# Patient Record
Sex: Female | Born: 1985 | Race: White | Hispanic: No | Marital: Single | Smoking: Current every day smoker
Health system: Southern US, Community
[De-identification: ages and names within clinical notes are randomized; demographics above are authoritative.]

## PROBLEM LIST (undated history)

## (undated) ENCOUNTER — Inpatient Hospital Stay (HOSPITAL_COMMUNITY): Payer: Self-pay

## (undated) DIAGNOSIS — N879 Dysplasia of cervix uteri, unspecified: Secondary | ICD-10-CM

## (undated) DIAGNOSIS — N200 Calculus of kidney: Secondary | ICD-10-CM

## (undated) DIAGNOSIS — Z8619 Personal history of other infectious and parasitic diseases: Secondary | ICD-10-CM

## (undated) DIAGNOSIS — N809 Endometriosis, unspecified: Secondary | ICD-10-CM

## (undated) DIAGNOSIS — Z86711 Personal history of pulmonary embolism: Secondary | ICD-10-CM

## (undated) DIAGNOSIS — J45909 Unspecified asthma, uncomplicated: Secondary | ICD-10-CM

## (undated) HISTORY — PX: GASTRIC BYPASS: SHX52

## (undated) HISTORY — PX: LAPAROSCOPY: SHX197

## (undated) HISTORY — DX: Personal history of pulmonary embolism: Z86.711

## (undated) HISTORY — DX: Personal history of other infectious and parasitic diseases: Z86.19

## (undated) HISTORY — PX: CHOLECYSTECTOMY: SHX55

---
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
## 2005-01-09 ENCOUNTER — Ambulatory Visit: Payer: Self-pay | Admitting: Family Medicine

## 2005-03-16 ENCOUNTER — Ambulatory Visit: Payer: Self-pay | Admitting: Family Medicine

## 2005-03-16 ENCOUNTER — Other Ambulatory Visit: Admission: RE | Admit: 2005-03-16 | Discharge: 2005-03-16 | Payer: Self-pay | Admitting: Family Medicine

## 2005-03-23 ENCOUNTER — Ambulatory Visit: Payer: Self-pay | Admitting: Family Medicine

## 2005-04-16 ENCOUNTER — Ambulatory Visit: Payer: Self-pay | Admitting: Family Medicine

## 2005-04-30 ENCOUNTER — Ambulatory Visit: Payer: Self-pay | Admitting: Family Medicine

## 2005-05-09 ENCOUNTER — Ambulatory Visit: Payer: Self-pay | Admitting: Family Medicine

## 2005-05-31 ENCOUNTER — Ambulatory Visit: Payer: Self-pay | Admitting: Family Medicine

## 2005-06-14 ENCOUNTER — Ambulatory Visit: Payer: Self-pay | Admitting: Family Medicine

## 2005-07-14 ENCOUNTER — Emergency Department (HOSPITAL_COMMUNITY): Admission: EM | Admit: 2005-07-14 | Discharge: 2005-07-15 | Payer: Self-pay | Admitting: Emergency Medicine

## 2005-07-16 ENCOUNTER — Emergency Department (HOSPITAL_COMMUNITY): Admission: EM | Admit: 2005-07-16 | Discharge: 2005-07-16 | Payer: Self-pay | Admitting: Emergency Medicine

## 2005-07-19 ENCOUNTER — Ambulatory Visit: Payer: Self-pay | Admitting: Family Medicine

## 2005-08-06 ENCOUNTER — Ambulatory Visit: Payer: Self-pay | Admitting: Family Medicine

## 2005-09-12 ENCOUNTER — Ambulatory Visit: Payer: Self-pay | Admitting: Family Medicine

## 2005-11-30 ENCOUNTER — Ambulatory Visit: Payer: Self-pay | Admitting: Family Medicine

## 2006-07-11 DIAGNOSIS — Z86711 Personal history of pulmonary embolism: Secondary | ICD-10-CM | POA: Diagnosis present

## 2007-10-17 ENCOUNTER — Emergency Department (HOSPITAL_COMMUNITY): Admission: EM | Admit: 2007-10-17 | Discharge: 2007-10-18 | Payer: Self-pay | Admitting: Emergency Medicine

## 2007-11-04 ENCOUNTER — Emergency Department (HOSPITAL_COMMUNITY): Admission: EM | Admit: 2007-11-04 | Discharge: 2007-11-04 | Payer: Self-pay | Admitting: Emergency Medicine

## 2007-11-20 ENCOUNTER — Emergency Department (HOSPITAL_COMMUNITY): Admission: EM | Admit: 2007-11-20 | Discharge: 2007-11-20 | Payer: Self-pay | Admitting: Family Medicine

## 2007-11-28 ENCOUNTER — Emergency Department (HOSPITAL_COMMUNITY): Admission: EM | Admit: 2007-11-28 | Discharge: 2007-11-28 | Payer: Self-pay | Admitting: Family Medicine

## 2008-11-15 ENCOUNTER — Observation Stay (HOSPITAL_COMMUNITY): Admission: EM | Admit: 2008-11-15 | Discharge: 2008-11-15 | Payer: Self-pay | Admitting: Emergency Medicine

## 2010-12-21 LAB — CBC
HCT: 42 % (ref 36.0–46.0)
Hemoglobin: 13.9 g/dL (ref 12.0–15.0)
MCHC: 33.1 g/dL (ref 30.0–36.0)
MCV: 88.5 fL (ref 78.0–100.0)
Platelets: 288 10*3/uL (ref 150–400)
RBC: 4.74 MIL/uL (ref 3.87–5.11)
RDW: 20.6 % — ABNORMAL HIGH (ref 11.5–15.5)
WBC: 8 10*3/uL (ref 4.0–10.5)

## 2010-12-21 LAB — COMPREHENSIVE METABOLIC PANEL
ALT: 23 U/L (ref 0–35)
AST: 20 U/L (ref 0–37)
Albumin: 4.4 g/dL (ref 3.5–5.2)
Alkaline Phosphatase: 92 U/L (ref 39–117)
BUN: 6 mg/dL (ref 6–23)
CO2: 23 mEq/L (ref 19–32)
Calcium: 9.5 mg/dL (ref 8.4–10.5)
Chloride: 107 mEq/L (ref 96–112)
Creatinine, Ser: 0.59 mg/dL (ref 0.4–1.2)
GFR calc Af Amer: >60 mL/min (ref >60)
GFR calc non Af Amer: >60 mL/min (ref >60)
Glucose, Bld: 86 mg/dL (ref 70–99)
Potassium: 3.7 mEq/L (ref 3.5–5.1)
Sodium: 139 mEq/L (ref 135–145)
Total Bilirubin: 0.6 mg/dL (ref 0.3–1.2)
Total Protein: 6.8 g/dL (ref 6.0–8.3)

## 2010-12-21 LAB — DIFFERENTIAL
Basophils Absolute: 0 10*3/uL (ref 0.0–0.1)
Basophils Relative: 0 % (ref 0–1)
Eosinophils Absolute: 0.2 10*3/uL (ref 0.0–0.7)
Eosinophils Relative: 3 % (ref 0–5)
Lymphocytes Relative: 38 % (ref 12–46)
Lymphs Abs: 3 10*3/uL (ref 0.7–4.0)
Monocytes Absolute: 0.5 10*3/uL (ref 0.1–1.0)
Monocytes Relative: 6 % (ref 3–12)
Neutro Abs: 4.3 10*3/uL (ref 1.7–7.7)
Neutrophils Relative %: 54 % (ref 43–77)

## 2010-12-21 LAB — URINALYSIS, ROUTINE W REFLEX MICROSCOPIC
Bilirubin Urine: NEGATIVE
Glucose, UA: NEGATIVE mg/dL
Hgb urine dipstick: NEGATIVE
Ketones, ur: >80 mg/dL — AB
Nitrite: NEGATIVE
Protein, ur: NEGATIVE mg/dL
Specific Gravity, Urine: 1.028 (ref 1.005–1.030)
Urobilinogen, UA: 0.2 mg/dL (ref 0.0–1.0)
pH: 6 (ref 5.0–8.0)

## 2010-12-21 LAB — POCT PREGNANCY, URINE: Preg Test, Ur: NEGATIVE

## 2011-01-23 NOTE — H&P (Signed)
NAMESHAUNICE, LEVITAN NO.:  [id number]   MEDICAL RECORD NO.:  [id number]          PATIENT TYPE:  INP   LOCATION:  1528                         FACILITY:  [facility]   PHYSICIAN:  Della Goo, M.D. DATE OF BIRTH:  1986-08-18   DATE OF ADMISSION:  11/15/2008  DATE OF DISCHARGE:                              HISTORY & PHYSICAL   PRIMARY CARE PHYSICIAN:  Unassigned.   CHIEF COMPLAINT:  Right-sided flank pain.   HISTORY OF PRESENT ILLNESS:  This is a 25 year old female who presents  to the emergency department with complaints of severe right-sided flank  pain which started in the early a.m.  She states the pain radiates into  the front of her abdomen.  She also states that she has had diarrhea for  the past 2 days and her son had been diagnosed with C. difficile  colitis.  She states that her diarrhea smells similar to her son's.  She  denies having any fevers or chills.  The patient does have a history of  previous kidney stones.  She denies having any nausea or vomiting.   PAST MEDICAL HISTORY:  1. Significant for a Roux-en-Y gastric bypass surgery.  2. History of asthma.  3. History of nephrolithiasis.  4. The patient is status post cholecystectomy.   MEDICATIONS:  Include albuterol, clonazepam and Cipro.   ALLERGIES:  MORPHINE, which causes itching.   SOCIAL HISTORY:  The patient smokes one-half pack of cigarettes daily.  She is a nondrinker and she denies any illicit drug usage.   FAMILY HISTORY:  Noncontributory.   REVIEW OF SYSTEMS:  Pertinents are mentioned above.   PHYSICAL EXAMINATION FINDINGS:  This is a 25 year old well-nourished,  well-developed female who is in discomfort but no acute distress.  VITAL SIGNS:  Temperature 97.8, blood pressure 125/83, heart rate 69,  respirations 24 and O2 saturations 100%.  HEENT EXAMINATION:  Normocephalic, atraumatic.  Pupils are equally  round, reactive to light.  Extraocular movements are intact,  funduscopic  benign.  Nares are patent bilaterally.  Oropharynx is clear.  NECK:  Supple, full range of motion.  No thyromegaly, adenopathy or  jugular venous distention.  CARDIOVASCULAR:  Regular rate and rhythm.  No murmurs, gallops or rubs.  LUNGS:  Clear to auscultation bilaterally.  ABDOMEN:  Positive bowel sounds, soft, mildly tender to palpation along  the right lateral abdominal area.  There is no rebound or guarding.  BACK EXAMINATION:  There is mild tenderness superficially along the  costovertebral angle margin.  EXTREMITIES:  Without cyanosis, clubbing or edema.  NEUROLOGIC EXAMINATION:  Nonfocal.   LABORATORY STUDIES:  White blood cell count 8.0, hemoglobin 13.9,  hematocrit 42.0, MCV 88.5, platelets 288, neutrophils 54% lymphocytes  38%.  Chemistry with a sodium of 139, potassium 3.7, chloride 107,  carbon dioxide 23, BUN 6, creatinine 0.59 and glucose 86.  Urinalysis is  essentially negative.  The only positive is urine ketones greater than  80.  CT scan of the abdomen and pelvis performed without contrast  reveals no evidence of obstruction or urinary tract calculi.   ASSESSMENT:  A 25 year old  female being admitted with:  1. Right flank pain.  2. Abdominal pain.  3. Possible partially treated urinary tract infection.  4. Acute diarrheal syndrome.   PLAN:  The patient will be admitted, placed on IV fluids for fluid  resuscitation, and pain control therapy has been ordered.  The patient  will continue on antibiotic therapy of Cipro, which will be given IV,  and metronidazole therapy will also be started.  Stool studies will also  be sent for culture and sensitivity and C. difficile toxin.  The patient  will also be placed on DVT and GI prophylaxis.  Further workup will  ensue pending results of the patient's clinical course.      Della Goo, M.D.  Electronically Signed     HJ/MEDQ  D:  11/15/2008  T:  11/15/2008  Job:  [id number]

## 2011-06-01 LAB — DIFFERENTIAL
Band Neutrophils: 0
Basophils Absolute: 0.1
Basophils Absolute: 0
Basophils Relative: 1
Basophils Relative: 0
Blasts: 0
Eosinophils Absolute: 0.2
Eosinophils Absolute: 0.4
Eosinophils Relative: 3
Eosinophils Relative: 4
Lymphocytes Relative: 35
Lymphocytes Relative: 40
Lymphs Abs: 2.5
Lymphs Abs: 3.3
Metamyelocytes Relative: 0
Monocytes Absolute: 0.6
Monocytes Absolute: 0.4
Monocytes Relative: 8
Monocytes Relative: 5
Myelocytes: 0
Neutro Abs: 3.6
Neutro Abs: 4.1
Neutrophils Relative %: 53
Neutrophils Relative %: 50
Promyelocytes Absolute: 0
nRBC: 0

## 2011-06-01 LAB — CBC
HCT: 38.7
HCT: 40.1
Hemoglobin: 13.1
Hemoglobin: 13.4
MCHC: 33.8
MCHC: 33.3
MCV: 86.9
MCV: 86.4
Platelets: 263
Platelets: 367
RBC: 4.45
RBC: 4.64
RDW: 15.8 — ABNORMAL HIGH
RDW: 15.2
WBC: 7
WBC: 8.2

## 2011-06-01 LAB — COMPREHENSIVE METABOLIC PANEL
ALT: 17
AST: 24
Albumin: 4.1
Alkaline Phosphatase: 104
BUN: 6
CO2: 26
Calcium: 9.4
Chloride: 107
Creatinine, Ser: 0.69
GFR calc Af Amer: >60
GFR calc non Af Amer: >60
Glucose, Bld: 105 — ABNORMAL HIGH
Potassium: 3.9
Sodium: 140
Total Bilirubin: 0.4
Total Protein: 6.5

## 2011-06-01 LAB — I-STAT 8, (EC8 V) (CONVERTED LAB)
Acid-base deficit: 2
BUN: <3 — ABNORMAL LOW
Bicarbonate: 21.7
Chloride: 107
Glucose, Bld: 97
HCT: 41
Hemoglobin: 13.9
Operator id: 295131
Potassium: 3.8
Sodium: 138
TCO2: 23
pCO2, Ven: 32.2 — ABNORMAL LOW
pH, Ven: 7.436 — ABNORMAL HIGH

## 2011-06-01 LAB — URINALYSIS, ROUTINE W REFLEX MICROSCOPIC
Bilirubin Urine: NEGATIVE
Bilirubin Urine: NEGATIVE
Glucose, UA: NEGATIVE
Glucose, UA: NEGATIVE
Hgb urine dipstick: NEGATIVE
Hgb urine dipstick: NEGATIVE
Ketones, ur: NEGATIVE
Ketones, ur: NEGATIVE
Nitrite: NEGATIVE
Nitrite: NEGATIVE
Protein, ur: NEGATIVE
Protein, ur: NEGATIVE
Specific Gravity, Urine: 1.01
Specific Gravity, Urine: 1.015
Urobilinogen, UA: 0.2
Urobilinogen, UA: 0.2
pH: 7
pH: 6

## 2011-06-01 LAB — URINE MICROSCOPIC-ADD ON

## 2011-06-01 LAB — HEPATIC FUNCTION PANEL
ALT: 31
AST: 44 — ABNORMAL HIGH
Albumin: 3.5
Alkaline Phosphatase: 94
Bilirubin, Direct: 0.2
Indirect Bilirubin: 0.4
Total Bilirubin: 0.6
Total Protein: 5.9 — ABNORMAL LOW

## 2011-06-01 LAB — LIPASE, BLOOD
Lipase: 17
Lipase: 29

## 2011-06-01 LAB — POCT PREGNANCY, URINE
Operator id: 294501
Preg Test, Ur: NEGATIVE

## 2011-06-01 LAB — POCT I-STAT CREATININE
Creatinine, Ser: 0.9
Operator id: 295131

## 2011-06-04 LAB — COMPREHENSIVE METABOLIC PANEL
AST: 15
Albumin: 4.3
Alkaline Phosphatase: 109
BUN: 10
Chloride: 110
GFR calc Af Amer: >60
Potassium: 3.8
Total Bilirubin: 0.2 — ABNORMAL LOW

## 2011-06-04 LAB — DIFFERENTIAL
Basophils Absolute: 0
Basophils Relative: 0
Eosinophils Absolute: 0.2
Eosinophils Relative: 3
Monocytes Absolute: 0.5

## 2011-06-04 LAB — POCT URINALYSIS DIP (DEVICE)
Glucose, UA: NEGATIVE
Hgb urine dipstick: NEGATIVE
Nitrite: NEGATIVE
Operator id: 247071
Protein, ur: 100 — AB
Urobilinogen, UA: 0.2

## 2011-06-04 LAB — URINALYSIS, ROUTINE W REFLEX MICROSCOPIC
Bilirubin Urine: NEGATIVE
Ketones, ur: NEGATIVE
Nitrite: NEGATIVE
Urobilinogen, UA: 0.2

## 2011-06-04 LAB — CBC
HCT: 39.3
Platelets: 288
WBC: 8.4

## 2011-06-04 LAB — POCT PREGNANCY, URINE: Preg Test, Ur: NEGATIVE

## 2011-06-04 LAB — PREGNANCY, URINE: Preg Test, Ur: NEGATIVE

## 2013-03-12 ENCOUNTER — Encounter (HOSPITAL_COMMUNITY): Payer: Self-pay | Admitting: *Deleted

## 2013-03-12 ENCOUNTER — Inpatient Hospital Stay (HOSPITAL_COMMUNITY)
Admission: AD | Admit: 2013-03-12 | Discharge: 2013-03-12 | Disposition: A | Discharge status: Home / Self Care | Payer: Self-pay | Source: Ambulatory Visit | Attending: Obstetrics & Gynecology | Admitting: Obstetrics & Gynecology

## 2013-03-12 DIAGNOSIS — N949 Unspecified condition associated with female genital organs and menstrual cycle: Secondary | ICD-10-CM | POA: Insufficient documentation

## 2013-03-12 DIAGNOSIS — R109 Unspecified abdominal pain: Secondary | ICD-10-CM | POA: Insufficient documentation

## 2013-03-12 DIAGNOSIS — R102 Pelvic and perineal pain: Secondary | ICD-10-CM

## 2013-03-12 DIAGNOSIS — N926 Irregular menstruation, unspecified: Secondary | ICD-10-CM | POA: Insufficient documentation

## 2013-03-12 HISTORY — DX: Unspecified asthma, uncomplicated: J45.909

## 2013-03-12 HISTORY — DX: Calculus of kidney: N20.0

## 2013-03-12 HISTORY — DX: Dysplasia of cervix uteri, unspecified: N87.9

## 2013-03-12 HISTORY — DX: Endometriosis, unspecified: N80.9

## 2013-03-12 LAB — URINALYSIS, ROUTINE W REFLEX MICROSCOPIC
Hgb urine dipstick: NEGATIVE
Ketones, ur: NEGATIVE mg/dL
Protein, ur: NEGATIVE mg/dL
Urobilinogen, UA: 0.2 mg/dL (ref 0.0–1.0)

## 2013-03-12 LAB — POCT PREGNANCY, URINE: Preg Test, Ur: NEGATIVE

## 2013-03-12 MED ORDER — HYDROMORPHONE HCL PF 2 MG/ML IJ SOLN
2.0000 mg | Freq: Once | INTRAMUSCULAR | Status: AC
  Administered 2013-03-12: 2 mg via INTRAMUSCULAR
  Filled 2013-03-12: qty 1

## 2013-03-12 MED ORDER — OXYCODONE-ACETAMINOPHEN 5-325 MG PO TABS: 1.0000 | ORAL_TABLET | Freq: Four times a day (QID) | ORAL | Status: DC | PRN

## 2013-03-12 NOTE — MAU Note (Signed)
Patient states she has a history of endometriosis and has been getting her care in [place]. Has had pain since her last period on 6-19 but has become severe since yesterday. Denies bleeding and has a vaginal discharge.

## 2013-03-12 NOTE — MAU Provider Note (Signed)
History     CSN: [id number]  Arrival date and time: 03/12/13 1721   First Provider Initiated Contact with Patient 03/12/13 1848      Chief Complaint  Patient presents with  . Abdominal Pain   HPI  Pt is a G1P1 here with report of lower pelvic pain that worsened this AM.  Patient's last menstrual period was 02/26/2013.  Pt has history of endometriosis and irregular cycles diagnoses by [facility]).  Pain has been well controlled after depo lupron until recently.  Currently sexually active, using condoms.  Pt declines STD testing.   Past Medical History  Diagnosis Date  . Endometriosis   . Asthma   . Kidney stones   . Cervical dysplasia     Past Surgical History  Procedure Laterality Date  . Laparoscopy    . Gastric bypass    . Cholecystectomy      History reviewed. No pertinent family history.  History  Substance Use Topics  . Smoking status: Current Every Day Smoker -- 1.00 packs/day    Types: Cigarettes  . Smokeless tobacco: Not on file  . Alcohol Use: No    Allergies: No Known Allergies  Prescriptions prior to admission  Medication Sig Dispense Refill  . acetaminophen (TYLENOL) 500 MG tablet Take 500 mg by mouth every 6 (six) hours as needed for pain.        Review of Systems  Gastrointestinal: Positive for nausea and abdominal pain (pelvic). Negative for vomiting.  All other systems reviewed and are negative.   Physical Exam   Blood pressure 121/80, pulse 101, temperature 98.6 F (37 C), temperature source Oral, resp. rate 20, height 5' 2.5" (1.588 m), weight 71.759 kg (158 lb 3.2 oz), last menstrual period 02/26/2013, SpO2 100.00%.  Physical Exam  Constitutional: She is oriented to person, place, and time. She appears well-developed and well-nourished. She appears distressed.  Appears uncomfortable  HENT:  Head: Normocephalic.  Eyes: Pupils are equal, round, and reactive to light.  Neck: Normal range of motion. Neck supple.   Cardiovascular: Normal rate, regular rhythm and normal heart sounds.   Respiratory: Effort normal and breath sounds normal.  GI: Soft. There is tenderness (lower pelvis).  Genitourinary: Cervix exhibits no motion tenderness. Right adnexum displays no mass and no tenderness. Left adnexum displays no mass and no tenderness. No vaginal discharge found.  Negative cervical motion tenderness  Neurological: She is alert and oriented to person, place, and time. She has normal reflexes.  Skin: Skin is warm and dry.    MAU Course  Procedures  Results for orders placed during the hospital encounter of 03/12/13 (from the past 24 hour(s))  URINALYSIS, ROUTINE W REFLEX MICROSCOPIC     Status: None   Collection Time    03/12/13  5:50 PM      Result Value Range   Color, Urine YELLOW  YELLOW   APPearance CLEAR  CLEAR   Specific Gravity, Urine 1.010  1.005 - 1.030   pH 6.0  5.0 - 8.0   Glucose, UA NEGATIVE  NEGATIVE mg/dL   Hgb urine dipstick NEGATIVE  NEGATIVE   Bilirubin Urine NEGATIVE  NEGATIVE   Ketones, ur NEGATIVE  NEGATIVE mg/dL   Protein, ur NEGATIVE  NEGATIVE mg/dL   Urobilinogen, UA 0.2  0.0 - 1.0 mg/dL   Nitrite NEGATIVE  NEGATIVE   Leukocytes, UA NEGATIVE  NEGATIVE  POCT PREGNANCY, URINE     Status: None   Collection Time    03/12/13  5:57  PM      Result Value Range   Preg Test, Ur NEGATIVE  NEGATIVE   Dilaudid 2 mg IM  1945 Pt reports relief with symptoms  Assessment and Plan  Pelvic Pain   Plan: DC to home RX Percocet 5/325 #15 (no refill) Follow-up in GYN clinic - message routed  [facility] 03/12/2013, 6:49 PM

## 2013-04-01 ENCOUNTER — Encounter: Payer: Self-pay | Admitting: Obstetrics & Gynecology

## 2013-05-06 ENCOUNTER — Encounter: Payer: Self-pay | Admitting: Obstetrics & Gynecology

## 2013-08-28 DIAGNOSIS — Z720 Tobacco use: Secondary | ICD-10-CM | POA: Insufficient documentation

## 2013-08-28 DIAGNOSIS — J45909 Unspecified asthma, uncomplicated: Secondary | ICD-10-CM | POA: Insufficient documentation

## 2014-02-27 ENCOUNTER — Emergency Department (HOSPITAL_COMMUNITY)
Admission: EM | Admit: 2014-02-27 | Discharge: 2014-02-27 | Disposition: A | Discharge status: Home / Self Care | Payer: Medicaid Other | Attending: Emergency Medicine | Admitting: Emergency Medicine

## 2014-02-27 ENCOUNTER — Emergency Department (HOSPITAL_COMMUNITY): Payer: Medicaid Other

## 2014-02-27 ENCOUNTER — Encounter (HOSPITAL_COMMUNITY): Payer: Self-pay | Admitting: Emergency Medicine

## 2014-02-27 DIAGNOSIS — N898 Other specified noninflammatory disorders of vagina: Secondary | ICD-10-CM | POA: Diagnosis not present

## 2014-02-27 DIAGNOSIS — N949 Unspecified condition associated with female genital organs and menstrual cycle: Secondary | ICD-10-CM | POA: Insufficient documentation

## 2014-02-27 DIAGNOSIS — Z8742 Personal history of other diseases of the female genital tract: Secondary | ICD-10-CM | POA: Insufficient documentation

## 2014-02-27 DIAGNOSIS — F17200 Nicotine dependence, unspecified, uncomplicated: Secondary | ICD-10-CM | POA: Diagnosis not present

## 2014-02-27 DIAGNOSIS — J45909 Unspecified asthma, uncomplicated: Secondary | ICD-10-CM | POA: Insufficient documentation

## 2014-02-27 DIAGNOSIS — Z3202 Encounter for pregnancy test, result negative: Secondary | ICD-10-CM | POA: Insufficient documentation

## 2014-02-27 DIAGNOSIS — R102 Pelvic and perineal pain: Secondary | ICD-10-CM

## 2014-02-27 LAB — COMPREHENSIVE METABOLIC PANEL
ALT: 10 U/L (ref 0–35)
AST: 15 U/L (ref 0–37)
Albumin: 3.8 g/dL (ref 3.5–5.2)
Alkaline Phosphatase: 72 U/L (ref 39–117)
BUN: 15 mg/dL (ref 6–23)
CALCIUM: 9.5 mg/dL (ref 8.4–10.5)
CO2: 23 meq/L (ref 19–32)
CREATININE: 0.69 mg/dL (ref 0.50–1.10)
Chloride: 101 mEq/L (ref 96–112)
GLUCOSE: 100 mg/dL — AB (ref 70–99)
Potassium: 4.2 mEq/L (ref 3.7–5.3)
SODIUM: 137 meq/L (ref 137–147)
Total Bilirubin: <0.2 mg/dL — ABNORMAL LOW (ref 0.3–1.2)
Total Protein: 7.6 g/dL (ref 6.0–8.3)

## 2014-02-27 LAB — CBC WITH DIFFERENTIAL/PLATELET
Basophils Absolute: 0 10*3/uL (ref 0.0–0.1)
Basophils Relative: 0 % (ref 0–1)
EOS PCT: 7 % — AB (ref 0–5)
Eosinophils Absolute: 0.4 10*3/uL (ref 0.0–0.7)
HEMATOCRIT: 39.2 % (ref 36.0–46.0)
HEMOGLOBIN: 12.8 g/dL (ref 12.0–15.0)
LYMPHS ABS: 3.4 10*3/uL (ref 0.7–4.0)
LYMPHS PCT: 55 % — AB (ref 12–46)
MCH: 26 pg (ref 26.0–34.0)
MCHC: 32.7 g/dL (ref 30.0–36.0)
MCV: 79.7 fL (ref 78.0–100.0)
MONO ABS: 0.2 10*3/uL (ref 0.1–1.0)
MONOS PCT: 4 % (ref 3–12)
Neutro Abs: 2.1 10*3/uL (ref 1.7–7.7)
Neutrophils Relative %: 34 % — ABNORMAL LOW (ref 43–77)
Platelets: 304 10*3/uL (ref 150–400)
RBC: 4.92 MIL/uL (ref 3.87–5.11)
RDW: 17.3 % — ABNORMAL HIGH (ref 11.5–15.5)
WBC: 6.1 10*3/uL (ref 4.0–10.5)

## 2014-02-27 LAB — URINALYSIS, ROUTINE W REFLEX MICROSCOPIC
Glucose, UA: NEGATIVE mg/dL
HGB URINE DIPSTICK: NEGATIVE
KETONES UR: NEGATIVE mg/dL
Leukocytes, UA: NEGATIVE
Nitrite: NEGATIVE
PH: 5.5 (ref 5.0–8.0)
Protein, ur: NEGATIVE mg/dL
Urobilinogen, UA: 0.2 mg/dL (ref 0.0–1.0)

## 2014-02-27 LAB — WET PREP, GENITAL
CLUE CELLS WET PREP: NONE SEEN
TRICH WET PREP: NONE SEEN
YEAST WET PREP: NONE SEEN

## 2014-02-27 LAB — PREGNANCY, URINE: Preg Test, Ur: NEGATIVE

## 2014-02-27 MED ORDER — OXYCODONE-ACETAMINOPHEN 5-325 MG PO TABS: 1.0000 | ORAL_TABLET | ORAL | Status: DC | PRN

## 2014-02-27 MED ORDER — HYDROMORPHONE HCL PF 1 MG/ML IJ SOLN
1.0000 mg | Freq: Once | INTRAMUSCULAR | Status: AC
  Administered 2014-02-27: 1 mg via INTRAVENOUS
  Filled 2014-02-27: qty 1

## 2014-02-27 MED ORDER — OXYCODONE-ACETAMINOPHEN 5-325 MG PO TABS
2.0000 | ORAL_TABLET | Freq: Once | ORAL | Status: AC
  Administered 2014-02-27: 2 via ORAL
  Filled 2014-02-27: qty 2

## 2014-02-27 MED ORDER — MORPHINE SULFATE 4 MG/ML IJ SOLN
4.0000 mg | Freq: Once | INTRAMUSCULAR | Status: AC
  Administered 2014-02-27: 4 mg via INTRAVENOUS
  Filled 2014-02-27: qty 1

## 2014-02-27 NOTE — Discharge Instructions (Signed)
Take percocet for severe pain - Please be careful with this medication.  It can cause drowsiness.  Use caution while driving, operating machinery, drinking alcohol, or any other activities that may impair your physical or mental abilities.   Return to the emergency department if you develop any changing/worsening condition, or any other concerns (please read additional information regarding your condition below)  Abdominal Pain Many things can cause abdominal pain. Usually, abdominal pain is not caused by a disease and will improve without treatment. It can often be observed and treated at home. Your health care provider will do a physical exam and possibly order blood tests and X-rays to help determine the seriousness of your pain. However, in many cases, more time must pass before a clear cause of the pain can be found. Before that point, your health care provider may not know if you need more testing or further treatment. HOME CARE INSTRUCTIONS  Monitor your abdominal pain for any changes. The following actions may help to alleviate any discomfort you are experiencing:  Only take over-the-counter or prescription medicines as directed by your health care provider.  Do not take laxatives unless directed to do so by your health care provider.  Try a clear liquid diet (broth, tea, or water) as directed by your health care provider. Slowly move to a bland diet as tolerated. SEEK MEDICAL CARE IF:  You have unexplained abdominal pain.  You have abdominal pain associated with nausea or diarrhea.  You have pain when you urinate or have a bowel movement.  You experience abdominal pain that wakes you in the night.  You have abdominal pain that is worsened or improved by eating food.  You have abdominal pain that is worsened with eating fatty foods.  You have a fever. SEEK IMMEDIATE MEDICAL CARE IF:   Your pain does not go away within 2 hours.  You keep throwing up (vomiting).  Your pain is  felt only in portions of the abdomen, such as the right side or the left lower portion of the abdomen.  You pass bloody or black tarry stools. MAKE SURE YOU:  Understand these instructions.   Will watch your condition.   Will get help right away if you are not doing well or get worse.  Document Released: 06/06/2005 Document Revised: 09/01/2013 Document Reviewed: 05/06/2013 [facility] Patient Information 2015 [place], [place]. This information is not intended to replace advice given to you by your health care provider. Make sure you discuss any questions you have with your health care provider.  Pelvic Pain, Female Female pelvic pain can be caused by many different things and start from a variety of places. Pelvic pain refers to pain that is located in the lower half of the abdomen and between your hips. The pain may occur over a short period of time (acute) or may be reoccurring (chronic). The cause of pelvic pain may be related to disorders affecting the female reproductive organs (gynecologic), but it may also be related to the bladder, kidney stones, an intestinal complication, or muscle or skeletal problems. Getting help right away for pelvic pain is important, especially if there has been severe, sharp, or a sudden onset of unusual pain. It is also important to get help right away because some types of pelvic pain can be life threatening.  CAUSES  Below are only some of the causes of pelvic pain. The causes of pelvic pain can be in one of several categories.   Gynecologic.  Pelvic inflammatory disease.  Sexually transmitted infection.  Ovarian cyst or a twisted ovarian ligament (ovarian torsion).  Uterine lining that grows outside the uterus (endometriosis).  Fibroids, cysts, or tumors.  Ovulation.  Pregnancy.  Pregnancy that occurs outside the uterus (ectopic pregnancy).  Miscarriage.  Labor.  Abruption of the placenta or ruptured uterus.  Infection.  Uterine  infection (endometritis).  Bladder infection.  Diverticulitis.  Miscarriage related to a uterine infection (septic abortion).  Bladder.  Inflammation of the bladder (cystitis).  Kidney stone(s).  Gastrointenstinal.  Constipation.  Diverticulitis.  Neurologic.  Trauma.  Feeling pelvic pain because of mental or emotional causes (psychosomatic).  Cancers of the bowel or pelvis. EVALUATION  Your caregiver will want to take a careful history of your concerns. This includes recent changes in your health, a careful gynecologic history of your periods (menses), and a sexual history. Obtaining your family history and medical history is also important. Your caregiver may suggest a pelvic exam. A pelvic exam will help identify the location and severity of the pain. It also helps in the evaluation of which organ system may be involved. In order to identify the cause of the pelvic pain and be properly treated, your caregiver may order tests. These tests may include:   A pregnancy test.  Pelvic ultrasonography.  An X-ray exam of the abdomen.  A urinalysis or evaluation of vaginal discharge.  Blood tests. HOME CARE INSTRUCTIONS   Only take over-the-counter or prescription medicines for pain, discomfort, or fever as directed by your caregiver.   Rest as directed by your caregiver.   Eat a balanced diet.   Drink enough fluids to make your urine clear or pale yellow, or as directed.   Avoid sexual intercourse if it causes pain.   Apply warm or cold compresses to the lower abdomen depending on which one helps the pain.   Avoid stressful situations.   Keep a journal of your pelvic pain. Write down when it started, where the pain is located, and if there are things that seem to be associated with the pain, such as food or your menstrual cycle.  Follow up with your caregiver as directed.  SEEK MEDICAL CARE IF:  Your medicine does not help your pain.  You have abnormal  vaginal discharge. SEEK IMMEDIATE MEDICAL CARE IF:   You have heavy bleeding from the vagina.   Your pelvic pain increases.   You feel lightheaded or faint.   You have chills.   You have pain with urination or blood in your urine.   You have uncontrolled diarrhea or vomiting.   You have a fever or persistent symptoms for more than 3 days.  You have a fever and your symptoms suddenly get worse.   You are being physically or sexually abused.  MAKE SURE YOU:  Understand these instructions.  Will watch your condition.  Will get help if you are not doing well or get worse. Document Released: 07/24/2004 Document Revised: 02/26/2012 Document Reviewed: 12/17/2011 [facility] CenterExitCare Patient Information 2015 [place], [place]. This information is not intended to replace advice given to you by your health care provider. Make sure you discuss any questions you have with your health care provider.

## 2014-02-27 NOTE — ED Notes (Signed)
Patient declined wheelchair. RN escorted patient to exit 

## 2014-02-27 NOTE — ED Provider Notes (Signed)
CSN: [id number]     Arrival date & time 02/27/14  1736 History   First MD Initiated Contact with Patient 02/27/14 1743     Chief Complaint  Patient presents with  . Pelvic Pain   HPI  Ana Reyes is a 28 y.o. female with a PMH of asthma, endometriosis, kidney stones, and cervical dysplasia who presents to the ED for evaluation of pelvic pain. History was provided by the patient. Patient states she developed pelvic pain this morning. States has been dealing with this for the past two years. Her pain is located in her lower abdomen diffusely with radiation to her lower back. Nothing makes it worse but being curled in the fetal position improves her pain. Pain is a constant stabbing pain. She tried taking Ibuprofen with no relief. She has had similar pain in the past with her endometriosis. No similar pain to kidney stones. She finished her menstrual cycle today. No vaginal discharge, dysuria, difficulty with urination, emesis, nausea, diarrhea, constipation, fevers, chills, or change in appetite/activity. Has appointment with OB/GYN on 03/01/14 (two days). Previous abdominal surgeries include appendectomy, cholecystectomy, and gastric bypass.    Past Medical History  Diagnosis Date  . Endometriosis   . Asthma   . Kidney stones   . Cervical dysplasia    Past Surgical History  Procedure Laterality Date  . Laparoscopy    . Gastric bypass    . Cholecystectomy     History reviewed. No pertinent family history. History  Substance Use Topics  . Smoking status: Current Every Day Smoker -- 1.00 packs/day    Types: Cigarettes  . Smokeless tobacco: Not on file  . Alcohol Use: No   OB History   Grav Para Term Preterm Abortions TAB SAB Ect Mult Living   1 1 1       1      Review of Systems  Constitutional: Negative for fever, chills, activity change, appetite change and fatigue.  Respiratory: Negative for cough and shortness of breath.   Cardiovascular: Negative for chest pain and leg  swelling.  Gastrointestinal: Positive for abdominal pain. Negative for nausea, vomiting, diarrhea and constipation.  Genitourinary: Positive for vaginal bleeding (menses) and pelvic pain. Negative for dysuria, urgency, frequency, hematuria, flank pain, decreased urine volume, vaginal discharge, difficulty urinating, genital sores, vaginal pain and menstrual problem.  Musculoskeletal: Positive for back pain. Negative for myalgias.  Neurological: Negative for dizziness, weakness, light-headedness and headaches.    Allergies  Review of patient's allergies indicates no known allergies.  Home Medications   Prior to Admission medications   Medication Sig Start Date End Date Taking? Authorizing Provider  acetaminophen (TYLENOL) 500 MG tablet Take 500 mg by mouth every 6 (six) hours as needed for pain.    Historical Provider, MD  albuterol (PROVENTIL HFA;VENTOLIN HFA) 108 (90 BASE) MCG/ACT inhaler Inhale 2 puffs into the lungs every 6 (six) hours as needed (SOB/Wheezing).    Historical Provider, MD  oxyCODONE-acetaminophen (PERCOCET/ROXICET) 5-325 MG per tablet Take 1-2 tablets by mouth every 6 (six) hours as needed for pain. 03/12/13   Melissa NoonWalidah N Muhammad, CNM  pimecrolimus (ELIDEL) 1 % cream Apply 1 application topically daily as needed (outbreaks).    Historical Provider, MD   BP 124/81  Pulse 99  Temp(Src) 98.7 F (37.1 C) (Oral)  Resp 13  SpO2 100%  Filed Vitals:   02/27/14 1756 02/27/14 1834 02/27/14 1915 02/27/14 2130  BP: 124/81 101/85 111/74 98/62  Pulse: 99 93 83 67  Temp: 98.7  F (37.1 C)     TempSrc: Oral     Resp: 13 24 16 17   SpO2: 100% 99% 99% 99%    Physical Exam  Nursing note and vitals reviewed. Constitutional: She is oriented to person, place, and time. She appears well-developed and well-nourished. No distress.  Patient tearful  HENT:  Head: Normocephalic and atraumatic.  Right Ear: External ear normal.  Left Ear: External ear normal.  Mouth/Throat: Oropharynx is  clear and moist.  Eyes: Conjunctivae are normal. Right eye exhibits no discharge. Left eye exhibits no discharge.  Neck: Normal range of motion. Neck supple.  Cardiovascular: Normal rate, regular rhythm and normal heart sounds.  Exam reveals no gallop and no friction rub.   No murmur heard. Pulmonary/Chest: Effort normal and breath sounds normal. No respiratory distress. She has no wheezes. She has no rales. She exhibits no tenderness.  Abdominal: Soft. She exhibits no distension and no mass. There is tenderness. There is no rebound and no guarding.  Diffuse tenderness to palpation to the lower abdomen  Genitourinary:  Thin white vaginal discharge present in the vaginal vault. No vaginal bleeding. Patient expresses discomfort throughout entire exam however does not have localized CMT or adnexal tenderness bilaterally  Musculoskeletal: Normal range of motion. She exhibits tenderness. She exhibits no edema.  Paraspinal tenderness bilaterally in the lumbar region. No lumbar spinal tenderness. Patient able to ambulate without difficulty or ataxia  Neurological: She is alert and oriented to person, place, and time.  Skin: Skin is warm and dry. She is not diaphoretic.    ED Course  Procedures (including critical care time) Labs Review Labs Reviewed - No data to display  Imaging Review [place] Transvaginal Non-ob  02/27/2014   CLINICAL DATA:  Pelvic pain.  EXAM: TRANSABDOMINAL AND TRANSVAGINAL ULTRASOUND OF PELVIS  DOPPLER ULTRASOUND OF OVARIES  TECHNIQUE: Both transabdominal and transvaginal ultrasound examinations of the pelvis were performed. Transabdominal technique was performed for global imaging of the pelvis including uterus, ovaries, adnexal regions, and pelvic cul-de-sac.  It was necessary to proceed with endovaginal exam following the transabdominal exam to visualize the uterus and ovaries in greater detail. Color and duplex Doppler ultrasound was utilized to evaluate blood flow to the ovaries.   COMPARISON:  CT of the abdomen and pelvis performed 12/18/2011, and pelvic ultrasound performed 04/27/2013  FINDINGS: Uterus  Measurements: 8.3 x 4.8 x 5.2 cm. No fibroids or other mass visualized. A trace amount of fluid is noted at the cervical canal.  Endometrium  Thickness: 0.2 cm.  No focal abnormality visualized.  Right ovary  Measurements: 4.4 x 2.1 x 1.9 cm. Normal appearance/no adnexal mass.  Left ovary  Measurements: 2.9 x 1.4 x 2.7 cm. Normal appearance/no adnexal mass.  Pulsed Doppler evaluation of both ovaries demonstrates normal low-resistance arterial and venous waveforms.  Other findings  Trace free fluid is seen within the pelvic cul-de-sac.  IMPRESSION: Trace fluid within the cervical canal. Otherwise unremarkable pelvic ultrasound. No evidence for ovarian torsion.   Electronically Signed   By: Roanna RaiderJeffery  Chang M.D.   On: 02/27/2014 21:26   [place] Pelvis Complete  02/27/2014   CLINICAL DATA:  Pelvic pain.  EXAM: TRANSABDOMINAL AND TRANSVAGINAL ULTRASOUND OF PELVIS  DOPPLER ULTRASOUND OF OVARIES  TECHNIQUE: Both transabdominal and transvaginal ultrasound examinations of the pelvis were performed. Transabdominal technique was performed for global imaging of the pelvis including uterus, ovaries, adnexal regions, and pelvic cul-de-sac.  It was necessary to proceed with endovaginal exam following the transabdominal exam to visualize  the uterus and ovaries in greater detail. Color and duplex Doppler ultrasound was utilized to evaluate blood flow to the ovaries.  COMPARISON:  CT of the abdomen and pelvis performed 12/18/2011, and pelvic ultrasound performed 04/27/2013  FINDINGS: Uterus  Measurements: 8.3 x 4.8 x 5.2 cm. No fibroids or other mass visualized. A trace amount of fluid is noted at the cervical canal.  Endometrium  Thickness: 0.2 cm.  No focal abnormality visualized.  Right ovary  Measurements: 4.4 x 2.1 x 1.9 cm. Normal appearance/no adnexal mass.  Left ovary  Measurements: 2.9 x 1.4 x 2.7 cm.  Normal appearance/no adnexal mass.  Pulsed Doppler evaluation of both ovaries demonstrates normal low-resistance arterial and venous waveforms.  Other findings  Trace free fluid is seen within the pelvic cul-de-sac.  IMPRESSION: Trace fluid within the cervical canal. Otherwise unremarkable pelvic ultrasound. No evidence for ovarian torsion.   Electronically Signed   By: Roanna Raider M.D.   On: 02/27/2014 21:26   [place] Art/ven Flow Abd Pelv Doppler  02/27/2014   CLINICAL DATA:  Pelvic pain.  EXAM: TRANSABDOMINAL AND TRANSVAGINAL ULTRASOUND OF PELVIS  DOPPLER ULTRASOUND OF OVARIES  TECHNIQUE: Both transabdominal and transvaginal ultrasound examinations of the pelvis were performed. Transabdominal technique was performed for global imaging of the pelvis including uterus, ovaries, adnexal regions, and pelvic cul-de-sac.  It was necessary to proceed with endovaginal exam following the transabdominal exam to visualize the uterus and ovaries in greater detail. Color and duplex Doppler ultrasound was utilized to evaluate blood flow to the ovaries.  COMPARISON:  CT of the abdomen and pelvis performed 12/18/2011, and pelvic ultrasound performed 04/27/2013  FINDINGS: Uterus  Measurements: 8.3 x 4.8 x 5.2 cm. No fibroids or other mass visualized. A trace amount of fluid is noted at the cervical canal.  Endometrium  Thickness: 0.2 cm.  No focal abnormality visualized.  Right ovary  Measurements: 4.4 x 2.1 x 1.9 cm. Normal appearance/no adnexal mass.  Left ovary  Measurements: 2.9 x 1.4 x 2.7 cm. Normal appearance/no adnexal mass.  Pulsed Doppler evaluation of both ovaries demonstrates normal low-resistance arterial and venous waveforms.  Other findings  Trace free fluid is seen within the pelvic cul-de-sac.  IMPRESSION: Trace fluid within the cervical canal. Otherwise unremarkable pelvic ultrasound. No evidence for ovarian torsion.   Electronically Signed   By: Roanna Raider M.D.   On: 02/27/2014 21:26     EKG  Interpretation None      Results for orders placed during the hospital encounter of 02/27/14  WET PREP, GENITAL      Result Value Ref Range   Yeast Wet Prep HPF POC NONE SEEN  NONE SEEN   Trich, Wet Prep NONE SEEN  NONE SEEN   Clue Cells Wet Prep HPF POC NONE SEEN  NONE SEEN   WBC, Wet Prep HPF POC FEW (*) NONE SEEN  URINALYSIS, ROUTINE W REFLEX MICROSCOPIC      Result Value Ref Range   Color, Urine YELLOW  YELLOW   APPearance CLEAR  CLEAR   Specific Gravity, Urine >1.030 (*) 1.005 - 1.030   pH 5.5  5.0 - 8.0   Glucose, UA NEGATIVE  NEGATIVE mg/dL   Hgb urine dipstick NEGATIVE  NEGATIVE   Bilirubin Urine SMALL (*) NEGATIVE   Ketones, ur NEGATIVE  NEGATIVE mg/dL   Protein, ur NEGATIVE  NEGATIVE mg/dL   Urobilinogen, UA 0.2  0.0 - 1.0 mg/dL   Nitrite NEGATIVE  NEGATIVE   Leukocytes, UA NEGATIVE  NEGATIVE  PREGNANCY, URINE      Result Value Ref Range   Preg Test, Ur NEGATIVE  NEGATIVE  CBC WITH DIFFERENTIAL      Result Value Ref Range   WBC 6.1  4.0 - 10.5 K/uL   RBC 4.92  3.87 - 5.11 MIL/uL   Hemoglobin 12.8  12.0 - 15.0 g/dL   HCT 96.0  45.4 - 09.8 %   MCV 79.7  78.0 - 100.0 fL   MCH 26.0  26.0 - 34.0 pg   MCHC 32.7  30.0 - 36.0 g/dL   RDW 11.9 (*) 14.7 - 82.9 %   Platelets 304  150 - 400 K/uL   Neutrophils Relative % 34 (*) 43 - 77 %   Neutro Abs 2.1  1.7 - 7.7 K/uL   Lymphocytes Relative 55 (*) 12 - 46 %   Lymphs Abs 3.4  0.7 - 4.0 K/uL   Monocytes Relative 4  3 - 12 %   Monocytes Absolute 0.2  0.1 - 1.0 K/uL   Eosinophils Relative 7 (*) 0 - 5 %   Eosinophils Absolute 0.4  0.0 - 0.7 K/uL   Basophils Relative 0  0 - 1 %   Basophils Absolute 0.0  0.0 - 0.1 K/uL  COMPREHENSIVE METABOLIC PANEL      Result Value Ref Range   Sodium 137  137 - 147 mEq/L   Potassium 4.2  3.7 - 5.3 mEq/L   Chloride 101  96 - 112 mEq/L   CO2 23  19 - 32 mEq/L   Glucose, Bld 100 (*) 70 - 99 mg/dL   BUN 15  6 - 23 mg/dL   Creatinine, Ser 5.62  0.50 - 1.10 mg/dL   Calcium 9.5  8.4 -  13.0 mg/dL   Total Protein 7.6  6.0 - 8.3 g/dL   Albumin 3.8  3.5 - 5.2 g/dL   AST 15  0 - 37 U/L   ALT 10  0 - 35 U/L   Alkaline Phosphatase 72  39 - 117 U/L   Total Bilirubin <0.2 (*) 0.3 - 1.2 mg/dL   GFR calc non Af Amer >90  >90 mL/min   GFR calc Af Amer >90  >90 mL/min    MDM   Ana Brunette is a 28 y.o. female  with a PMH of asthma, endometriosis, kidney stones, and cervical dysplasia who presents to the ED for evaluation of pelvic pain. Patient states her pain is unchanged from her chronic pelvic pain, which she states is due to her endometriosis. US revealed trace fluid within the cervical canal, otherwise is unremarkable. No ovarian torsion. Pelvic exam benign. PID unlikely. Wet mount WNL. UA negative. Labs unremarkable. Abdominal exam benign. Vital signs stable. Patient had improvements in her pain throughout her ED visit. Has appointment with OB/GYN in two days. Return precautions, discharge instructions, and follow-up was discussed with the patient before discharge.    Rechecks  10:20 PM = Pain 4/10. Offered patient CT scan to further evaluate however she declined stating this is chronic. Repeat abdominal exam benign however still exhibits tenderness in the lower abdomen. States she feels well enough for discharge.      Discharge Medication List as of 02/27/2014 10:44 PM    START taking these medications   Details  oxyCODONE-acetaminophen (PERCOCET/ROXICET) 5-325 MG per tablet Take 1-2 tablets by mouth every 4 (four) hours as needed for moderate pain or severe pain., Starting 02/27/2014, Until Discontinued, Print  Final impressions: 1. Pelvic pain in female      Luiz Iron PA-C   This patient was discussed with Dr. Candise Bowens, PA-C 02/28/14 [phone]

## 2014-02-27 NOTE — ED Notes (Signed)
She states her normal period ended today and then she began to have severe pelvic pain. She has a history of endometriosis and states this feels the same. She has appt with OBGYN for pelvic pain in 2 days but the pain was more severe today

## 2014-02-27 NOTE — ED Notes (Addendum)
Pt c/o lower abd pain that radiates into her back. sts she has had this pain before because of her endometriosis. sts she was on her menstrual period and usually it lasts for 2 more days but it ended today. Denies n/v/d/fevers. Denies vaginal discharge/bumps/rashes. Denies possibility of being pregnant, sts she is on birth control. sts she has made an appointment with her GYN but the appointment is not until Monday, June 22nd, sts the pain is just too bad to wait until then. Nad, skin warm and dry, resp e/u.

## 2014-02-28 NOTE — ED Provider Notes (Signed)
Medical screening examination/treatment/procedure(s) were conducted as a shared visit with non-physician practitioner(s) and myself.  I personally evaluated the patient during the encounter.  Exacerbation of chronic pelvic pain, worse with menstruation.  No vomiting, fever, change in bowel habits. Abdomen soft without peritoneal signs.   EKG Interpretation None       Glynn OctaveStephen Rancour, MD 02/28/14 [phone]

## 2014-03-01 LAB — GC/CHLAMYDIA PROBE AMP
CT Probe RNA: NEGATIVE
GC Probe RNA: NEGATIVE

## 2014-05-07 ENCOUNTER — Emergency Department: Payer: Self-pay | Admitting: Internal Medicine

## 2014-05-07 LAB — DRUG SCREEN, URINE
AMPHETAMINES, UR SCREEN: NEGATIVE (ref ?–1000)
BARBITURATES, UR SCREEN: NEGATIVE (ref ?–200)
Benzodiazepine, Ur Scrn: POSITIVE (ref ?–200)
COCAINE METABOLITE, UR ~~LOC~~: NEGATIVE (ref ?–300)
Cannabinoid 50 Ng, Ur ~~LOC~~: POSITIVE (ref ?–50)
MDMA (ECSTASY) UR SCREEN: NEGATIVE (ref ?–500)
Methadone, Ur Screen: NEGATIVE (ref ?–300)
OPIATE, UR SCREEN: NEGATIVE (ref ?–300)
Phencyclidine (PCP) Ur S: NEGATIVE (ref ?–25)
Tricyclic, Ur Screen: NEGATIVE (ref ?–1000)

## 2014-05-07 LAB — COMPREHENSIVE METABOLIC PANEL
ANION GAP: 5 — AB (ref 7–16)
AST: 16 U/L (ref 15–37)
Albumin: 3.3 g/dL — ABNORMAL LOW (ref 3.4–5.0)
Alkaline Phosphatase: 71 U/L
BILIRUBIN TOTAL: 0.2 mg/dL (ref 0.2–1.0)
BUN: 8 mg/dL (ref 7–18)
CALCIUM: 8.2 mg/dL — AB (ref 8.5–10.1)
Chloride: 112 mmol/L — ABNORMAL HIGH (ref 98–107)
Co2: 24 mmol/L (ref 21–32)
Creatinine: 0.86 mg/dL (ref 0.60–1.30)
EGFR (African American): >60
EGFR (Non-African Amer.): >60
Glucose: 60 mg/dL — ABNORMAL LOW (ref 65–99)
Osmolality: 277 (ref 275–301)
POTASSIUM: 3.9 mmol/L (ref 3.5–5.1)
SGPT (ALT): 9 U/L — ABNORMAL LOW
Sodium: 141 mmol/L (ref 136–145)
TOTAL PROTEIN: 7.2 g/dL (ref 6.4–8.2)

## 2014-05-07 LAB — CBC
HCT: 34.3 % — ABNORMAL LOW (ref 35.0–47.0)
HGB: 10.8 g/dL — AB (ref 12.0–16.0)
MCH: 26.1 pg (ref 26.0–34.0)
MCHC: 31.5 g/dL — AB (ref 32.0–36.0)
MCV: 83 fL (ref 80–100)
Platelet: 360 10*3/uL (ref 150–440)
RBC: 4.14 10*6/uL (ref 3.80–5.20)
RDW: 19.2 % — ABNORMAL HIGH (ref 11.5–14.5)
WBC: 9.7 10*3/uL (ref 3.6–11.0)

## 2014-05-07 LAB — URINALYSIS, COMPLETE
Bilirubin,UR: NEGATIVE
Blood: NEGATIVE
Glucose,UR: NEGATIVE mg/dL (ref 0–75)
Leukocyte Esterase: NEGATIVE
NITRITE: NEGATIVE
Ph: 5 (ref 4.5–8.0)
Protein: NEGATIVE
Specific Gravity: 1.03 (ref 1.003–1.030)
Squamous Epithelial: 12
WBC UR: 7 /HPF (ref 0–5)

## 2014-05-07 LAB — ETHANOL: Ethanol: <3 mg/dL

## 2014-05-07 LAB — ACETAMINOPHEN LEVEL

## 2014-05-07 LAB — SALICYLATE LEVEL: Salicylates, Serum: 12.8 mg/dL — ABNORMAL HIGH

## 2014-07-12 ENCOUNTER — Encounter (HOSPITAL_COMMUNITY): Payer: Self-pay | Admitting: Emergency Medicine

## 2014-12-09 DIAGNOSIS — F988 Other specified behavioral and emotional disorders with onset usually occurring in childhood and adolescence: Secondary | ICD-10-CM | POA: Insufficient documentation

## 2014-12-09 DIAGNOSIS — N809 Endometriosis, unspecified: Secondary | ICD-10-CM | POA: Diagnosis present

## 2015-02-14 ENCOUNTER — Encounter: Payer: Self-pay | Admitting: Emergency Medicine

## 2015-02-14 ENCOUNTER — Emergency Department
Admission: EM | Admit: 2015-02-14 | Discharge: 2015-02-14 | Disposition: A | Discharge status: Home / Self Care | Payer: Medicaid Other | Attending: Emergency Medicine | Admitting: Emergency Medicine

## 2015-02-14 DIAGNOSIS — Z793 Long term (current) use of hormonal contraceptives: Secondary | ICD-10-CM | POA: Insufficient documentation

## 2015-02-14 DIAGNOSIS — S3992XA Unspecified injury of lower back, initial encounter: Secondary | ICD-10-CM | POA: Diagnosis present

## 2015-02-14 DIAGNOSIS — Y998 Other external cause status: Secondary | ICD-10-CM | POA: Diagnosis not present

## 2015-02-14 DIAGNOSIS — Y92410 Unspecified street and highway as the place of occurrence of the external cause: Secondary | ICD-10-CM | POA: Diagnosis not present

## 2015-02-14 DIAGNOSIS — S39012A Strain of muscle, fascia and tendon of lower back, initial encounter: Secondary | ICD-10-CM | POA: Diagnosis not present

## 2015-02-14 DIAGNOSIS — Z720 Tobacco use: Secondary | ICD-10-CM | POA: Diagnosis not present

## 2015-02-14 DIAGNOSIS — S161XXA Strain of muscle, fascia and tendon at neck level, initial encounter: Secondary | ICD-10-CM | POA: Insufficient documentation

## 2015-02-14 DIAGNOSIS — Y9389 Activity, other specified: Secondary | ICD-10-CM | POA: Insufficient documentation

## 2015-02-14 MED ORDER — CYCLOBENZAPRINE HCL 10 MG PO TABS: 10.0000 mg | ORAL_TABLET | Freq: Three times a day (TID) | ORAL | Status: DC | PRN

## 2015-02-14 MED ORDER — NAPROXEN 500 MG PO TABS
500.0000 mg | ORAL_TABLET | Freq: Two times a day (BID) | ORAL | Status: AC
Start: 2015-02-14 — End: 2016-02-14
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
## 2015-06-13 IMAGING — US US TRANSVAGINAL NON-OB
1 series · 13 of 25 positions shown · non-contrast
Comparison: CT of the abdomen and pelvis performed 12/18/2011, and
pelvic ultrasound performed 04/27/2013

CLINICAL DATA: Pelvic pain.

EXAM:
TRANSABDOMINAL AND TRANSVAGINAL ULTRASOUND OF PELVIS
DOPPLER ULTRASOUND OF OVARIES
TECHNIQUE: Both transabdominal and transvaginal ultrasound examinations of the
pelvis were performed. Transabdominal technique was performed for
global imaging of the pelvis including uterus, ovaries, adnexal
regions, and pelvic cul-de-sac.
It was necessary to proceed with endovaginal exam following the
transabdominal exam to visualize the uterus and ovaries in greater
detail. Color and duplex Doppler ultrasound was utilized to evaluate
blood flow to the ovaries.

[Series 1: us transvaginal non-ob · 0.20mm/px · 13 of 65 slices shown]
[im 1/65]
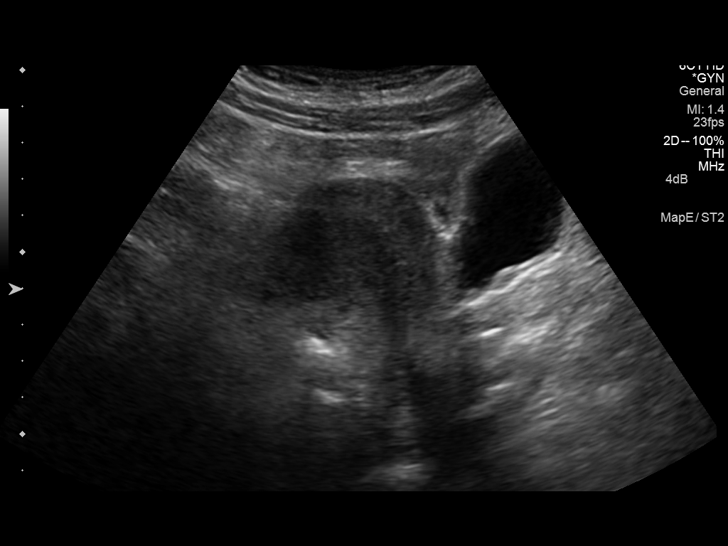
[im 6/65]
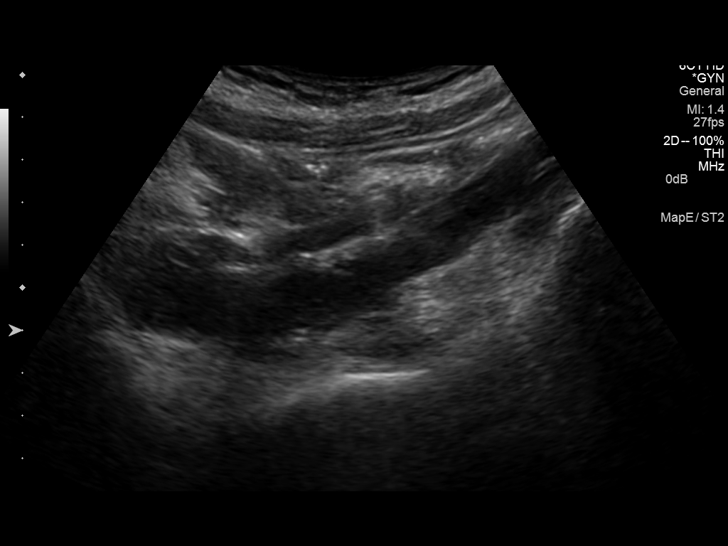
[im 11/65]
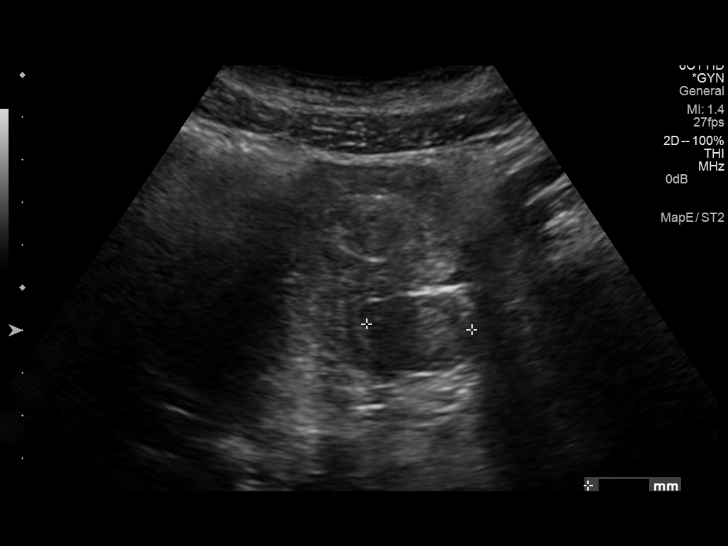
[im 17/65]
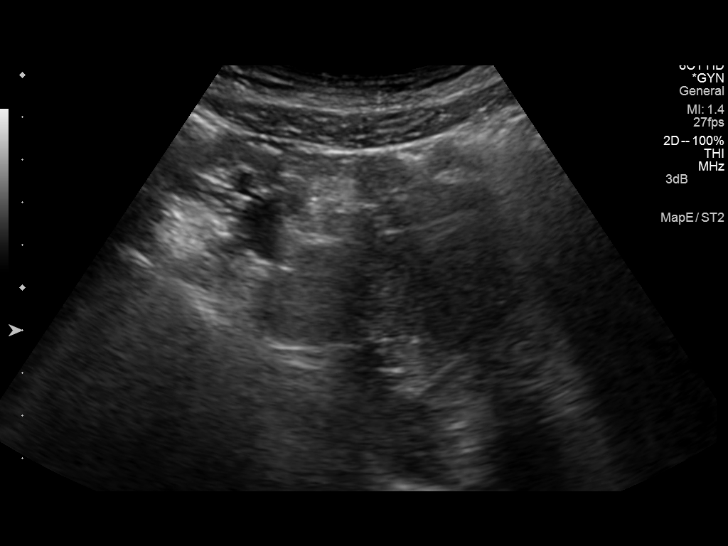
[im 22/65]
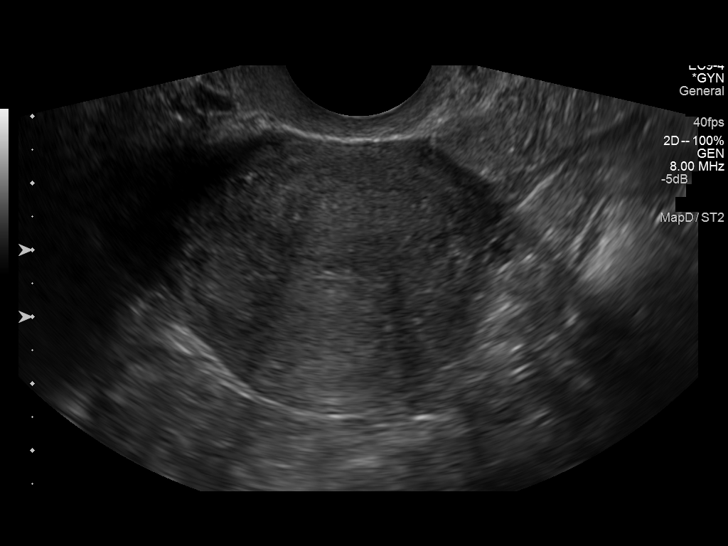
[im 27/65]
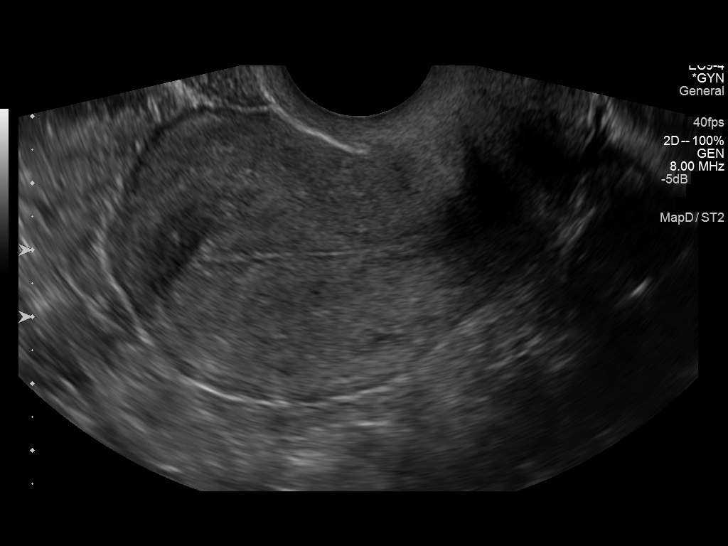
[im 33/65]
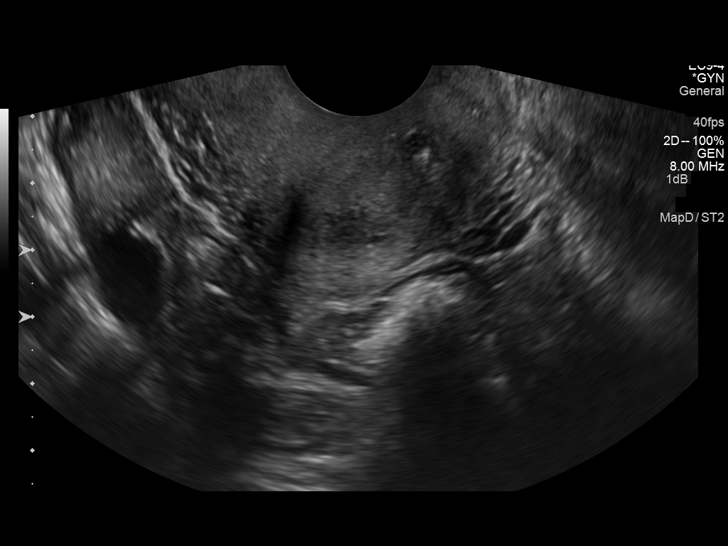
[im 38/65]
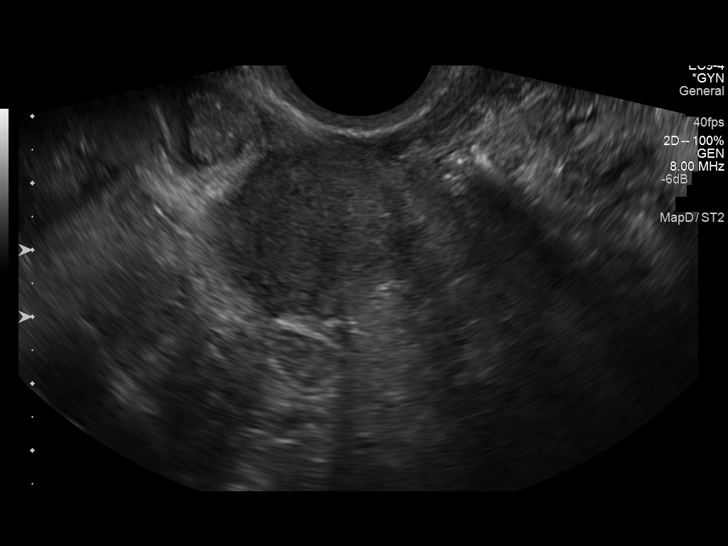
[im 43/65]
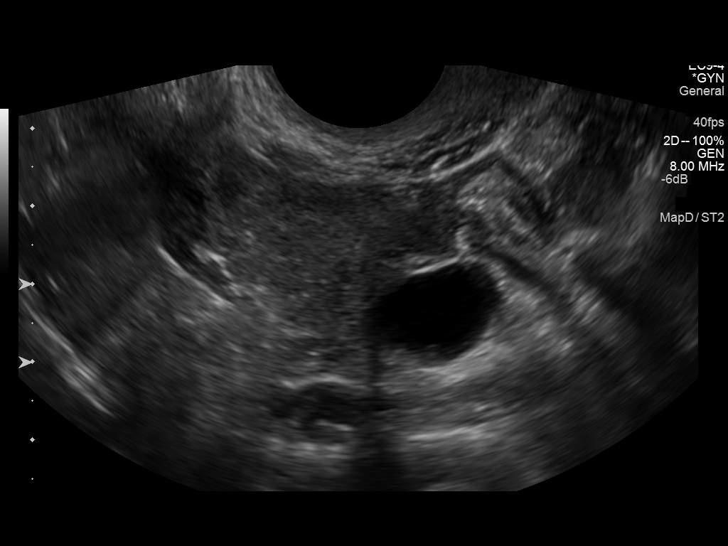
[im 49/65]
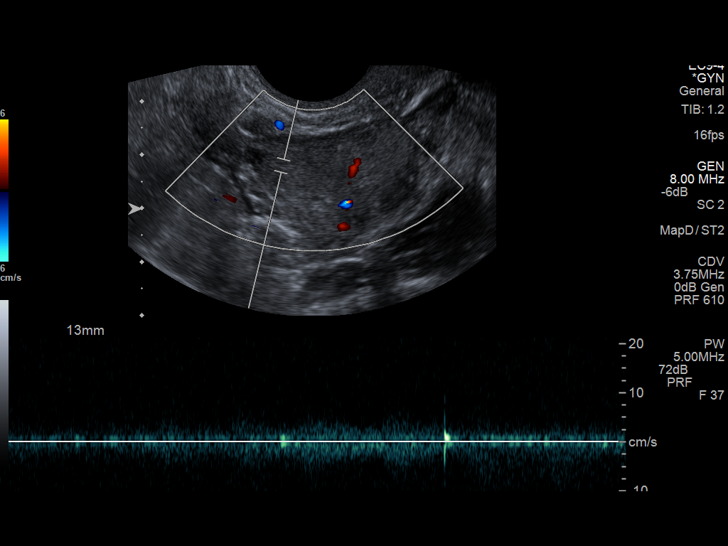
[im 54/65]
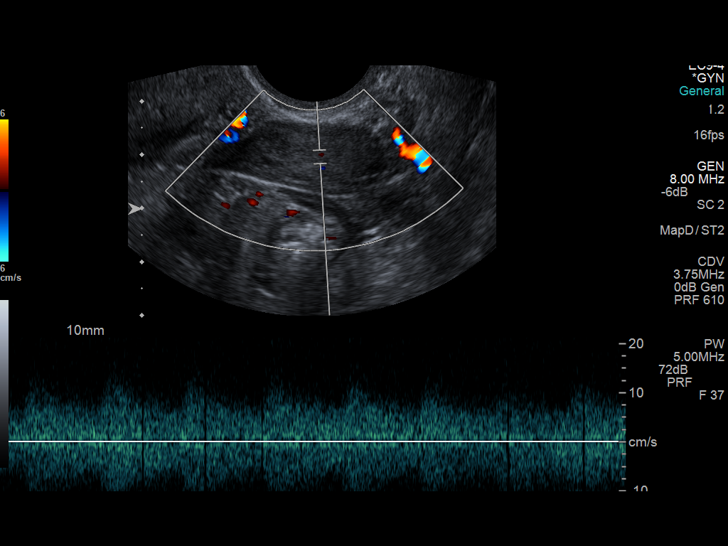
[im 59/65]
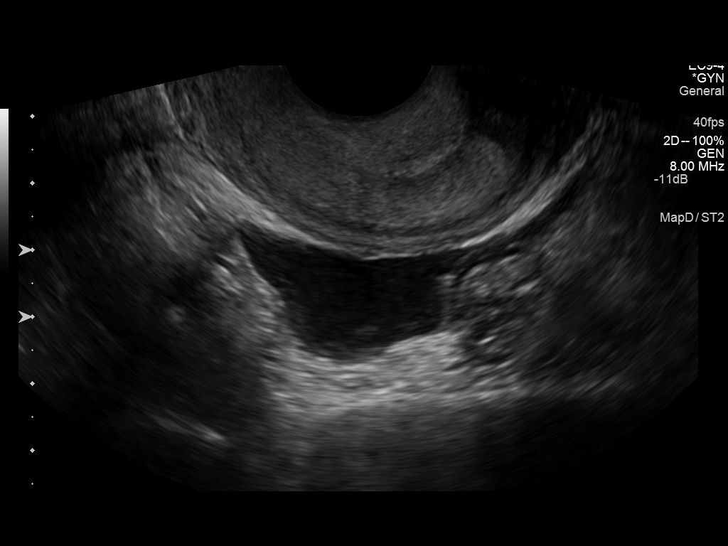
[im 65/65]
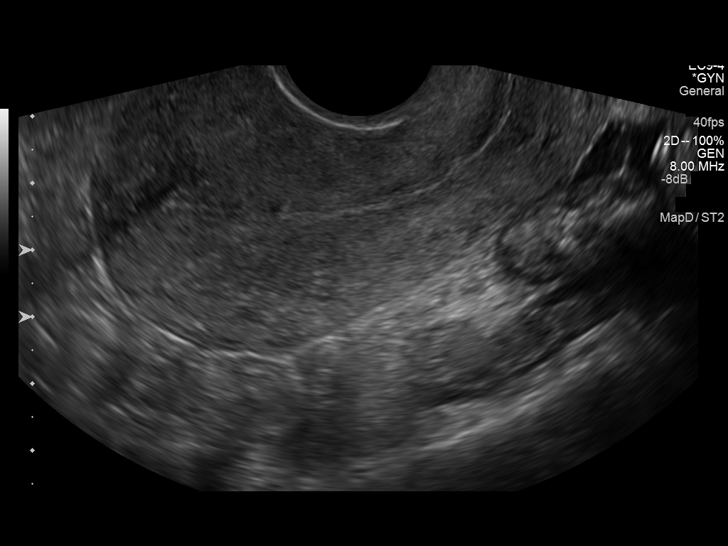

[13 of 25 positions shown; findings below may reference images not displayed]

FINDINGS: Uterus

Measurements: 8.3 x 4.8 x 5.2 cm. No fibroids or other mass
visualized. A trace amount of fluid is noted at the cervical canal.

Endometrium

Thickness: 0.2 cm.  No focal abnormality visualized.

Right ovary

Measurements: 4.4 x 2.1 x 1.9 cm. Normal appearance/no adnexal mass.

Left ovary

Measurements: 2.9 x 1.4 x 2.7 cm. Normal appearance/no adnexal mass.

Pulsed Doppler evaluation of both ovaries demonstrates normal
low-resistance arterial and venous waveforms.

Other findings

Trace free fluid is seen within the pelvic cul-de-sac.
IMPRESSION: Trace fluid within the cervical canal. Otherwise unremarkable pelvic
ultrasound. No evidence for ovarian torsion.

## 2015-10-08 DIAGNOSIS — B1920 Unspecified viral hepatitis C without hepatic coma: Secondary | ICD-10-CM | POA: Diagnosis present

## 2017-03-14 LAB — HM PAP SMEAR: HM Pap smear: ABNORMAL

## 2018-08-19 DIAGNOSIS — F39 Unspecified mood [affective] disorder: Secondary | ICD-10-CM | POA: Diagnosis present

## 2018-09-10 NOTE — L&D Delivery Note (Signed)
Patient: Ana Reyes MRN: [id number]  GBS status: Negative, IAP given: None   Patient is a 33 y.o. now [id number] s/p NSVD at [redacted]w[redacted]d, who was admitted for SROM. SROM 9h 75m prior to delivery with light meconium stained fluid.    Delivery Note At 6:39 AM a viable female was delivered via Vaginal, Spontaneous (Presentation: OA).  APGAR: 8, 9; weight pending.   Placenta status: spontaneous, intact.  Cord: 3 vessel with the following complications: nuchal x1.    Anesthesia:  Epidural  Episiotomy: None Lacerations: 1st degree;Perineal Suture Repair: 3.0 vicryl rapide Est. Blood Loss (mL): 100   Head delivered OA. Nuchal cord x1 present, reduced at perineum. Compound left posterior hand noted.Shoulder and body delivered in usual fashion.  Infant with spontaneous cry, placed on mother's abdomen, dried and bulb suctioned. Cord clamped x 2 after 1-minute delay, and cut by family member. Cord blood drawn. Placenta delivered spontaneously with gentle cord traction. Fundus firm with massage and Pitocin. Perineum inspected and found to have 1st degree laceration, which was repaired with 3.0 vicryl rapide with good hemostasis achieved.  Mom to postpartum.  Baby to Couplet care / Skin to Skin.  De Hollingshead 11/12/2018, 7:16 AM

## 2018-09-15 DIAGNOSIS — O0933 Supervision of pregnancy with insufficient antenatal care, third trimester: Secondary | ICD-10-CM

## 2018-09-15 DIAGNOSIS — F1990 Other psychoactive substance use, unspecified, uncomplicated: Secondary | ICD-10-CM | POA: Diagnosis present

## 2018-09-16 DIAGNOSIS — Z9884 Bariatric surgery status: Secondary | ICD-10-CM

## 2018-09-16 LAB — OB RESULTS CONSOLE HIV ANTIBODY (ROUTINE TESTING): HIV: NONREACTIVE

## 2018-09-16 LAB — OB RESULTS CONSOLE ABO/RH: RH Type: POSITIVE

## 2018-09-16 LAB — OB RESULTS CONSOLE ANTIBODY SCREEN: Antibody Screen: NEGATIVE

## 2018-09-16 LAB — OB RESULTS CONSOLE HEPATITIS B SURFACE ANTIGEN: Hepatitis B Surface Ag: NEGATIVE

## 2018-11-11 ENCOUNTER — Encounter (HOSPITAL_COMMUNITY): Payer: Self-pay | Admitting: *Deleted

## 2018-11-11 ENCOUNTER — Inpatient Hospital Stay (HOSPITAL_COMMUNITY)
Admission: EM | Admit: 2018-11-11 | Discharge: 2018-11-14 | DRG: 806 | Disposition: A | Discharge status: Home / Self Care | Payer: Medicaid Other | Attending: Family Medicine | Admitting: Family Medicine

## 2018-11-11 ENCOUNTER — Other Ambulatory Visit: Payer: Self-pay

## 2018-11-11 DIAGNOSIS — B1920 Unspecified viral hepatitis C without hepatic coma: Secondary | ICD-10-CM | POA: Diagnosis present

## 2018-11-11 DIAGNOSIS — O9842 Viral hepatitis complicating childbirth: Secondary | ICD-10-CM | POA: Diagnosis present

## 2018-11-11 DIAGNOSIS — Z7901 Long term (current) use of anticoagulants: Secondary | ICD-10-CM

## 2018-11-11 DIAGNOSIS — O4292 Full-term premature rupture of membranes, unspecified as to length of time between rupture and onset of labor: Secondary | ICD-10-CM | POA: Diagnosis present

## 2018-11-11 DIAGNOSIS — O4202 Full-term premature rupture of membranes, onset of labor within 24 hours of rupture: Secondary | ICD-10-CM | POA: Diagnosis not present

## 2018-11-11 DIAGNOSIS — O99334 Smoking (tobacco) complicating childbirth: Secondary | ICD-10-CM | POA: Diagnosis present

## 2018-11-11 DIAGNOSIS — Z23 Encounter for immunization: Secondary | ICD-10-CM

## 2018-11-11 DIAGNOSIS — O9952 Diseases of the respiratory system complicating childbirth: Secondary | ICD-10-CM | POA: Diagnosis present

## 2018-11-11 DIAGNOSIS — F17210 Nicotine dependence, cigarettes, uncomplicated: Secondary | ICD-10-CM | POA: Diagnosis present

## 2018-11-11 DIAGNOSIS — F39 Unspecified mood [affective] disorder: Secondary | ICD-10-CM | POA: Diagnosis present

## 2018-11-11 DIAGNOSIS — O480 Post-term pregnancy: Secondary | ICD-10-CM | POA: Diagnosis present

## 2018-11-11 DIAGNOSIS — O99844 Bariatric surgery status complicating childbirth: Secondary | ICD-10-CM | POA: Diagnosis present

## 2018-11-11 DIAGNOSIS — Z86711 Personal history of pulmonary embolism: Secondary | ICD-10-CM | POA: Diagnosis present

## 2018-11-11 DIAGNOSIS — O4290 Premature rupture of membranes, unspecified as to length of time between rupture and onset of labor, unspecified weeks of gestation: Secondary | ICD-10-CM | POA: Diagnosis present

## 2018-11-11 DIAGNOSIS — Z9884 Bariatric surgery status: Secondary | ICD-10-CM

## 2018-11-11 DIAGNOSIS — F1990 Other psychoactive substance use, unspecified, uncomplicated: Secondary | ICD-10-CM | POA: Diagnosis present

## 2018-11-11 DIAGNOSIS — J45909 Unspecified asthma, uncomplicated: Secondary | ICD-10-CM | POA: Diagnosis present

## 2018-11-11 DIAGNOSIS — Z3A41 41 weeks gestation of pregnancy: Secondary | ICD-10-CM | POA: Diagnosis not present

## 2018-11-11 DIAGNOSIS — N809 Endometriosis, unspecified: Secondary | ICD-10-CM | POA: Diagnosis present

## 2018-11-11 DIAGNOSIS — O0933 Supervision of pregnancy with insufficient antenatal care, third trimester: Secondary | ICD-10-CM

## 2018-11-11 LAB — CBC
HCT: 39.5 % (ref 36.0–46.0)
Hemoglobin: 12.6 g/dL (ref 12.0–15.0)
MCH: 28.4 pg (ref 26.0–34.0)
MCHC: 31.9 g/dL (ref 30.0–36.0)
MCV: 89 fL (ref 80.0–100.0)
Platelets: 312 10*3/uL (ref 150–400)
RBC: 4.44 MIL/uL (ref 3.87–5.11)
RDW: 19.3 % — ABNORMAL HIGH (ref 11.5–15.5)
WBC: 15 10*3/uL — ABNORMAL HIGH (ref 4.0–10.5)
nRBC: 0 % (ref 0.0–0.2)

## 2018-11-11 MED ORDER — OXYTOCIN 40 UNITS IN NORMAL SALINE INFUSION - SIMPLE MED
2.5000 [IU]/h | INTRAVENOUS | Status: DC
  Administered 2018-11-12: 2.5 [IU]/h via INTRAVENOUS
  Filled 2018-11-11: qty 1000

## 2018-11-11 MED ORDER — LACTATED RINGERS IV SOLN
500.0000 mL | INTRAVENOUS | Status: DC | PRN
  Administered 2018-11-11: 1000 mL via INTRAVENOUS

## 2018-11-11 MED ORDER — FENTANYL CITRATE (PF) 100 MCG/2ML IJ SOLN: 100.0000 ug | INTRAMUSCULAR | Status: DC | PRN

## 2018-11-11 MED ORDER — OXYCODONE-ACETAMINOPHEN 5-325 MG PO TABS: 1.0000 | ORAL_TABLET | ORAL | Status: DC | PRN

## 2018-11-11 MED ORDER — SOD CITRATE-CITRIC ACID 500-334 MG/5ML PO SOLN: 30.0000 mL | ORAL | Status: DC | PRN

## 2018-11-11 MED ORDER — OXYCODONE-ACETAMINOPHEN 5-325 MG PO TABS: 2.0000 | ORAL_TABLET | ORAL | Status: DC | PRN

## 2018-11-11 MED ORDER — OXYTOCIN BOLUS FROM INFUSION
500.0000 mL | Freq: Once | INTRAVENOUS | Status: AC
  Administered 2018-11-12: 500 mL via INTRAVENOUS

## 2018-11-11 MED ORDER — ACETAMINOPHEN 325 MG PO TABS: 650.0000 mg | ORAL_TABLET | ORAL | Status: DC | PRN

## 2018-11-11 MED ORDER — ONDANSETRON HCL 4 MG/2ML IJ SOLN: 4.0000 mg | Freq: Four times a day (QID) | INTRAMUSCULAR | Status: DC | PRN

## 2018-11-11 MED ORDER — LIDOCAINE HCL (PF) 1 % IJ SOLN: 30.0000 mL | INTRAMUSCULAR | Status: DC | PRN

## 2018-11-11 MED ORDER — LACTATED RINGERS IV SOLN
INTRAVENOUS | Status: DC
  Administered 2018-11-12: via INTRAVENOUS

## 2018-11-11 NOTE — MAU Note (Signed)
PT SAYS SROM  AT 930PM WHILE IN [facility].- CLEAR FLUID.  PNC  AT [facility]- BUT PLAN WAS  TO DELIVER  AT [facility] BUT  WAS AT [facility].     LAST SEEN IN [facility]  IN FEB- NO VE .    DENIES HSV AND MRSA. Marland Kitchen   UNSURE OF GBS.    FEELS  SOME UC.

## 2018-11-11 NOTE — H&P (Signed)
LABOR AND DELIVERY ADMISSION HISTORY AND PHYSICAL NOTE  Ana Reyes is a 33 y.o. female [phone] with IUP at [redacted]w[redacted]d by second trimester presenting for SROM at 2100 on 3/3. Noted to have light meconium stained fluid in MAU. Reports painful contractions since ROM.  She reports positive fetal movement. She denies leakage of fluid or vaginal bleeding.  Prenatal History/Complications: PNC at [facility]  Pregnancy complications:  - limited PNC  -substance use disorder, h/o THC, prescription narcotics, and benzos; previously on methadone several years ago  -h/o PE x2, on Lovenox (last took 2 weeks ago)  -Hep C, untreated  -tobacco use (1/2 ppd)  -asthma, currently uses ProAir prn  -h/o gastric bypass surgery  -h/o endometriosis   Past Medical History: Past Medical History:  Diagnosis Date  . Asthma   . Cervical dysplasia   . Endometriosis   . Kidney stones     Past Surgical History: Past Surgical History:  Procedure Laterality Date  . CHOLECYSTECTOMY    . GASTRIC BYPASS    . LAPAROSCOPY      Obstetrical History: OB History    Gravida  4   Para  1   Term  1   Preterm      AB  2   Living  1     SAB  2   TAB      Ectopic      Multiple      Live Births  1           Social History: Social History   Socioeconomic History  . Marital status: Single    Spouse name: Not on file  . Number of children: Not on file  . Years of education: Not on file  . Highest education level: Not on file  Occupational History  . Not on file  Social Needs  . Financial resource strain: Not on file  . Food insecurity:    Worry: Not on file    Inability: Not on file  . Transportation needs:    Medical: Not on file    Non-medical: Not on file  Tobacco Use  . Smoking status: Current Every Day Smoker    Packs/day: 1.00    Types: Cigarettes  Substance and Sexual Activity  . Alcohol use: No  . Drug use: No  . Sexual activity: Yes    Birth control/protection: Condom   Lifestyle  . Physical activity:    Days per week: Not on file    Minutes per session: Not on file  . Stress: Not on file  Relationships  . Social connections:    Talks on phone: Not on file    Gets together: Not on file    Attends religious service: Not on file    Active member of club or organization: Not on file    Attends meetings of clubs or organizations: Not on file    Relationship status: Not on file  Other Topics Concern  . Not on file  Social History Narrative  . Not on file    Family History: No family history on file.  Allergies: No Known Allergies  Medications Prior to Admission  Medication Sig Dispense Refill Last Dose  . albuterol (PROVENTIL HFA;VENTOLIN HFA) 108 (90 BASE) MCG/ACT inhaler Inhale 2 puffs into the lungs every 6 (six) hours as needed (SOB/Wheezing).   02/26/2014 at Unknown time  . cyclobenzaprine (FLEXERIL) 10 MG tablet Take 1 tablet (10 mg total) by mouth 3 (three) times daily as needed for  muscle spasms. 30 tablet 0   . levonorgestrel-ethinyl estradiol (SEASONALE,INTROVALE,JOLESSA) 0.15-0.03 MG tablet Take 1 tablet by mouth daily.   02/27/2014 at Unknown time  . oxyCODONE-acetaminophen (PERCOCET/ROXICET) 5-325 MG per tablet Take 1-2 tablets by mouth every 4 (four) hours as needed for moderate pain or severe pain. 12 tablet 0      Review of Systems  All systems reviewed and negative except as stated in HPI  Physical Exam Blood pressure 112/77, pulse 93, temperature 98.3 F (36.8 C), temperature source Oral, resp. rate 16, height 5\' 3"  (1.6 m), weight 87.4 kg. General appearance: alert, oriented, NAD Lungs: normal respiratory effort Heart: regular rate Abdomen: soft, non-tender; gravid, FH appropriate for GA Extremities: No calf swelling or tenderness Presentation: cephalic Fetal monitoring: 140 bpm, moderate variability, +acels, no decels  Uterine activity: Irregular with irritability  Dilation: 1.5 Effacement (%): 70 Station: -1 Exam  by:: Wynelle Bourgeois CNM  Prenatal labs: ABO, Rh: --/--/O POS, O POS Performed at [facility], [address] [place], [place] [address]  [phone]) Antibody: NEG (03/03 2312) Rubella:  Unknown  RPR:   Pending  HBsAg: Negative (01/07 0000)  HIV: Non-reactive (01/07 0000)  GC/Chlamydia: Pending  GBS:   Negative by PCR  HgB A1c: Pending  Genetic screening:  Too late to care  Anatomy US: Normal   Prenatal Transfer Tool  Maternal Diabetes: No Genetic Screening: Declined Maternal Ultrasounds/Referrals: Normal Fetal Ultrasounds or other Referrals:  None Maternal Substance Abuse:  Yes:  Type: Smoker, Marijuana, Prescription drugs Significant Maternal Medications:  None Significant Maternal Lab Results: Lab values include: Group B Strep negative  Results for orders placed or performed during the hospital encounter of 11/11/18 (from the past 24 hour(s))  Urine rapid drug screen (hosp performed)   Collection Time: 11/11/18 10:37 PM  Result Value Ref Range   Opiates NONE DETECTED NONE DETECTED   Cocaine NONE DETECTED NONE DETECTED   Benzodiazepines NONE DETECTED NONE DETECTED   Amphetamines POSITIVE (A) NONE DETECTED   Tetrahydrocannabinol POSITIVE (A) NONE DETECTED   Barbiturates NONE DETECTED NONE DETECTED  Group B strep by PCR   Collection Time: 11/11/18 10:50 PM  Result Value Ref Range   Group B strep by PCR NEGATIVE NEGATIVE  CBC   Collection Time: 11/11/18 11:12 PM  Result Value Ref Range   WBC 15.0 (H) 4.0 - 10.5 K/uL   RBC 4.44 3.87 - 5.11 MIL/uL   Hemoglobin 12.6 12.0 - 15.0 g/dL   HCT 25.3 66.4 - 40.3 %   MCV 89.0 80.0 - 100.0 fL   MCH 28.4 26.0 - 34.0 pg   MCHC 31.9 30.0 - 36.0 g/dL   RDW 47.4 (H) 25.9 - 56.3 %   Platelets 312 150 - 400 K/uL   nRBC 0.0 0.0 - 0.2 %  Type and screen [facility]   Collection Time: 11/11/18 11:12 PM  Result Value Ref Range   ABO/RH(D) O POS    Antibody Screen NEG    Sample Expiration       11/14/2018 Performed at [facility], [address], [place], [place] [address]   ABO/Rh   Collection Time: 11/11/18 11:12 PM  Result Value Ref Range   ABO/RH(D)      O POS Performed at [facility], [address], [place], [place] [address]     Patient Active Problem List   Diagnosis Date Noted  . Premature rupture of membranes 11/11/2018    Assessment: Ana Reyes is a 33 y.o. [id number] at [redacted]w[redacted]d here for PROM.   #Labor: Patient requests expectant management until epidural placed. Will plan to recheck cervix and start augmentation if needed once epidural in place.  #Pain: Epidural upon maternal request  #FWB: Cat I  #ID:  GBS neg  #MOF: Both  #MOC:Had signed BTL paperwork but now declines. Considering Nexplanon.   De Hollingshead 11/12/2018, 1:24 AM

## 2018-11-12 ENCOUNTER — Encounter (HOSPITAL_COMMUNITY): Payer: Self-pay | Admitting: *Deleted

## 2018-11-12 ENCOUNTER — Inpatient Hospital Stay (HOSPITAL_COMMUNITY): Payer: Medicaid Other | Admitting: Anesthesiology

## 2018-11-12 DIAGNOSIS — O4202 Full-term premature rupture of membranes, onset of labor within 24 hours of rupture: Secondary | ICD-10-CM

## 2018-11-12 DIAGNOSIS — O9842 Viral hepatitis complicating childbirth: Secondary | ICD-10-CM

## 2018-11-12 DIAGNOSIS — O480 Post-term pregnancy: Secondary | ICD-10-CM

## 2018-11-12 DIAGNOSIS — B1920 Unspecified viral hepatitis C without hepatic coma: Secondary | ICD-10-CM

## 2018-11-12 DIAGNOSIS — Z3A41 41 weeks gestation of pregnancy: Secondary | ICD-10-CM

## 2018-11-12 LAB — RAPID URINE DRUG SCREEN, HOSP PERFORMED
Amphetamines: POSITIVE — AB
Barbiturates: NOT DETECTED
Benzodiazepines: NOT DETECTED
Cocaine: NOT DETECTED
Opiates: NOT DETECTED
TETRAHYDROCANNABINOL: POSITIVE — AB

## 2018-11-12 LAB — RPR: RPR Ser Ql: NONREACTIVE

## 2018-11-12 LAB — TYPE AND SCREEN
ABO/RH(D): O POS
Antibody Screen: NEGATIVE

## 2018-11-12 LAB — ABO/RH: ABO/RH(D): O POS

## 2018-11-12 LAB — GROUP B STREP BY PCR: Group B strep by PCR: NEGATIVE

## 2018-11-12 LAB — HEMOGLOBIN A1C
Hgb A1c MFr Bld: 5.5 % (ref 4.8–5.6)
Mean Plasma Glucose: 111.15 mg/dL

## 2018-11-12 MED ORDER — ONDANSETRON HCL 4 MG PO TABS: 4.0000 mg | ORAL_TABLET | ORAL | Status: DC | PRN

## 2018-11-12 MED ORDER — TETANUS-DIPHTH-ACELL PERTUSSIS 5-2.5-18.5 LF-MCG/0.5 IM SUSP
0.5000 mL | Freq: Once | INTRAMUSCULAR | Status: DC
  Filled 2018-11-12: qty 0.5

## 2018-11-12 MED ORDER — PRENATAL MULTIVITAMIN CH
1.0000 | ORAL_TABLET | Freq: Every day | ORAL | Status: DC
  Administered 2018-11-12 – 2018-11-14 (×3): 1 via ORAL
  Filled 2018-11-12 (×3): qty 1

## 2018-11-12 MED ORDER — SENNOSIDES-DOCUSATE SODIUM 8.6-50 MG PO TABS
2.0000 | ORAL_TABLET | ORAL | Status: DC
  Administered 2018-11-12 – 2018-11-13 (×2): 2 via ORAL
  Filled 2018-11-12 (×2): qty 2

## 2018-11-12 MED ORDER — DIPHENHYDRAMINE HCL 50 MG/ML IJ SOLN: 12.5000 mg | INTRAMUSCULAR | Status: DC | PRN

## 2018-11-12 MED ORDER — LIDOCAINE HCL (PF) 1 % IJ SOLN
INTRAMUSCULAR | Status: DC | PRN
  Administered 2018-11-12 (×2): 4 mL via EPIDURAL

## 2018-11-12 MED ORDER — LACTATED RINGERS IV SOLN
500.0000 mL | Freq: Once | INTRAVENOUS | Status: AC
  Administered 2018-11-12: 500 mL via INTRAVENOUS

## 2018-11-12 MED ORDER — DIBUCAINE 1 % RE OINT: 1.0000 "application " | TOPICAL_OINTMENT | RECTAL | Status: DC | PRN

## 2018-11-12 MED ORDER — MEASLES, MUMPS & RUBELLA VAC IJ SOLR: 0.5000 mL | Freq: Once | INTRAMUSCULAR | Status: DC

## 2018-11-12 MED ORDER — FENTANYL-BUPIVACAINE-NACL 0.5-0.125-0.9 MG/250ML-% EP SOLN
12.0000 mL/h | EPIDURAL | Status: DC | PRN
  Filled 2018-11-12: qty 250

## 2018-11-12 MED ORDER — SODIUM CHLORIDE (PF) 0.9 % IJ SOLN
INTRAMUSCULAR | Status: DC | PRN
  Administered 2018-11-12: 12 mL/h via EPIDURAL

## 2018-11-12 MED ORDER — PNEUMOCOCCAL VAC POLYVALENT 25 MCG/0.5ML IJ INJ
0.5000 mL | INJECTION | INTRAMUSCULAR | Status: AC
  Administered 2018-11-14: 0.5 mL via INTRAMUSCULAR
  Filled 2018-11-12: qty 0.5

## 2018-11-12 MED ORDER — ALBUTEROL SULFATE (2.5 MG/3ML) 0.083% IN NEBU
2.5000 mg | INHALATION_SOLUTION | Freq: Four times a day (QID) | RESPIRATORY_TRACT | Status: DC | PRN
  Administered 2018-11-12: 2.5 mg via RESPIRATORY_TRACT
  Filled 2018-11-12: qty 3

## 2018-11-12 MED ORDER — EPHEDRINE 5 MG/ML INJ: 10.0000 mg | INTRAVENOUS | Status: DC | PRN

## 2018-11-12 MED ORDER — ONDANSETRON HCL 4 MG/2ML IJ SOLN: 4.0000 mg | INTRAMUSCULAR | Status: DC | PRN

## 2018-11-12 MED ORDER — COCONUT OIL OIL: 1.0000 "application " | TOPICAL_OIL | Status: DC | PRN

## 2018-11-12 MED ORDER — NICOTINE 14 MG/24HR TD PT24
14.0000 mg | MEDICATED_PATCH | Freq: Every day | TRANSDERMAL | Status: DC
  Administered 2018-11-12: 14 mg via TRANSDERMAL
  Filled 2018-11-12: qty 1

## 2018-11-12 MED ORDER — TERBUTALINE SULFATE 1 MG/ML IJ SOLN: 0.2500 mg | Freq: Once | INTRAMUSCULAR | Status: DC | PRN

## 2018-11-12 MED ORDER — OXYTOCIN 40 UNITS IN NORMAL SALINE INFUSION - SIMPLE MED
1.0000 m[IU]/min | INTRAVENOUS | Status: DC
  Administered 2018-11-12: 2 m[IU]/min via INTRAVENOUS

## 2018-11-12 MED ORDER — ACETAMINOPHEN 325 MG PO TABS
650.0000 mg | ORAL_TABLET | ORAL | Status: DC | PRN
  Administered 2018-11-12 – 2018-11-14 (×12): 650 mg via ORAL
  Filled 2018-11-12 (×12): qty 2

## 2018-11-12 MED ORDER — SIMETHICONE 80 MG PO CHEW: 80.0000 mg | CHEWABLE_TABLET | ORAL | Status: DC | PRN

## 2018-11-12 MED ORDER — PHENYLEPHRINE 40 MCG/ML (10ML) SYRINGE FOR IV PUSH (FOR BLOOD PRESSURE SUPPORT)
80.0000 ug | PREFILLED_SYRINGE | INTRAVENOUS | Status: DC | PRN
  Filled 2018-11-12: qty 10

## 2018-11-12 MED ORDER — NICOTINE 7 MG/24HR TD PT24
7.0000 mg | MEDICATED_PATCH | Freq: Every day | TRANSDERMAL | Status: DC
  Administered 2018-11-13: 7 mg via TRANSDERMAL
  Filled 2018-11-12 (×5): qty 1

## 2018-11-12 MED ORDER — WITCH HAZEL-GLYCERIN EX PADS: 1.0000 "application " | MEDICATED_PAD | CUTANEOUS | Status: DC | PRN

## 2018-11-12 MED ORDER — BENZOCAINE-MENTHOL 20-0.5 % EX AERO
1.0000 "application " | INHALATION_SPRAY | CUTANEOUS | Status: DC | PRN
  Administered 2018-11-12: 1 via TOPICAL
  Filled 2018-11-12: qty 56

## 2018-11-12 MED ORDER — ZOLPIDEM TARTRATE 5 MG PO TABS: 5.0000 mg | ORAL_TABLET | Freq: Every evening | ORAL | Status: DC | PRN

## 2018-11-12 MED ORDER — AEROCHAMBER PLUS FLO-VU MEDIUM MISC: 1.0000 | Freq: Once | Status: DC

## 2018-11-12 MED ORDER — DIPHENHYDRAMINE HCL 25 MG PO CAPS: 25.0000 mg | ORAL_CAPSULE | Freq: Four times a day (QID) | ORAL | Status: DC | PRN

## 2018-11-12 MED ORDER — ALBUTEROL SULFATE (2.5 MG/3ML) 0.083% IN NEBU
2.5000 mg | INHALATION_SOLUTION | RESPIRATORY_TRACT | Status: DC | PRN
  Administered 2018-11-12: 2.5 mg via RESPIRATORY_TRACT
  Filled 2018-11-12 (×2): qty 3

## 2018-11-12 MED ORDER — OXYCODONE HCL 5 MG PO TABS
5.0000 mg | ORAL_TABLET | ORAL | Status: DC | PRN
  Administered 2018-11-12 – 2018-11-14 (×12): 5 mg via ORAL
  Filled 2018-11-12 (×12): qty 1

## 2018-11-12 MED ORDER — IBUPROFEN 600 MG PO TABS
600.0000 mg | ORAL_TABLET | Freq: Four times a day (QID) | ORAL | Status: DC
  Administered 2018-11-12 – 2018-11-14 (×9): 600 mg via ORAL
  Filled 2018-11-12 (×9): qty 1

## 2018-11-12 MED ORDER — PHENYLEPHRINE 40 MCG/ML (10ML) SYRINGE FOR IV PUSH (FOR BLOOD PRESSURE SUPPORT): 80.0000 ug | PREFILLED_SYRINGE | INTRAVENOUS | Status: DC | PRN

## 2018-11-12 MED ORDER — NICOTINE 14 MG/24HR TD PT24
14.0000 mg | MEDICATED_PATCH | Freq: Every day | TRANSDERMAL | Status: DC
  Filled 2018-11-12: qty 1

## 2018-11-12 NOTE — Lactation Note (Signed)
This note was copied from a baby's chart. Lactation Consultation Note  Patient Name: Ana Reyes [id number] Date: 11/12/2018  Term baby, history of gastric bypass surgery,  history of low/no milk supply with first baby (33 yrs old)  History of substance abuse (urine tox screen pending)  Visited with P2 Mom of term baby at 42 hrs old.  Baby had breastfed right after delivery and a few other times.  Mom stated that baby was suckling on her nipple.  Baby being held swaddled in blanket.  Recommended unwrapping baby and placing her STS.    Reviewed breast massage and hand expression.  Breasts noted to be widely spaced and probable insufficient glandular tissue (IGT).  Mom denies any breast changes with this pregnancy.  Mom reports first baby was admitted to [facility] for severe jaundice and weight loss.  Hand expressed small drop of colostrum onto nipple. Discussed with Mom LC's concern about Mom's breasts being suspicious of IGT, and with her history of "no milk" with her first baby, recommendations given.  Latch assist given, baby latches and sleepily sucks rhythmically, no swallows identified presently.    Plan recommended- 1- Keep baby STS as much as possible 2- Offer breast with any feeding cues, goal of 8-12 feedings per 24 hrs. 3- Offer 5-10 ml supplement of EBM+/formula, increasing per guidelines. 4- Pump both breasts 15-20 mins, using breast massage and hand expression to stimulate milk supply.  Mom not sure if she wants to pump.  Mom shared that she was so upset when her 1st baby was admitted to hospital at 20 weeks old for severe jaundice and weight loss, and how "we almost lost him".    Mom in agreement with offering supplement after breastfeeding.  Mom will notify her RN if she would like to pump to stimulate milk supply.    Lactation brochure left in room.  Mom aware of IP and OP lactation support available to her.  Encouraged to call prn.   Consult Status Consult  Status: Follow-up Date: 11/13/18 Follow-up type: In-patient    Ana Reyes 11/12/2018, 12:40 PM

## 2018-11-12 NOTE — Anesthesia Preprocedure Evaluation (Signed)
Anesthesia Evaluation  Patient identified by MRN, date of birth, ID band Patient awake    Reviewed: Allergy & Precautions, NPO status , Patient's Chart, lab work & pertinent test results  Airway Mallampati: II  TM Distance: >3 FB Neck ROM: Full    Dental   Pulmonary asthma , Current Smoker,    Pulmonary exam normal breath sounds clear to auscultation       Cardiovascular negative cardio ROS Normal cardiovascular exam Rhythm:Regular Rate:Normal     Neuro/Psych negative neurological ROS  negative psych ROS   GI/Hepatic negative GI ROS, Neg liver ROS,   Endo/Other  negative endocrine ROS  Renal/GU Renal disease     Musculoskeletal negative musculoskeletal ROS (+)   Abdominal   Peds  Hematology negative hematology ROS (+)   Anesthesia Other Findings   Reproductive/Obstetrics (+) Pregnancy                             Anesthesia Physical Anesthesia Plan  ASA: II  Anesthesia Plan: Epidural   Post-op Pain Management:    Induction:   PONV Risk Score and Plan:   Airway Management Planned:   Additional Equipment:   Intra-op Plan:   Post-operative Plan:   Informed Consent: I have reviewed the patients History and Physical, chart, labs and discussed the procedure including the risks, benefits and alternatives for the proposed anesthesia with the patient or authorized representative who has indicated his/her understanding and acceptance.       Plan Discussed with:   Anesthesia Plan Comments:         Anesthesia Quick Evaluation

## 2018-11-12 NOTE — Anesthesia Postprocedure Evaluation (Signed)
Anesthesia Post Note  Patient: Ana Reyes  Procedure(s) Performed: AN AD HOC LABOR EPIDURAL     Patient location during evaluation: Mother Baby Anesthesia Type: Epidural Level of consciousness: awake Pain management: pain level controlled Vital Signs Assessment: post-procedure vital signs reviewed and stable Respiratory status: spontaneous breathing Cardiovascular status: stable Postop Assessment: patient able to bend at knees and epidural receding Anesthetic complications: no    Last Vitals:  Vitals:   11/12/18 0830 11/12/18 0945  BP: 107/77 115/79  Pulse: 71 73  Resp: 16 16  Temp: 36.5 C 36.6 C  SpO2: 99% 100%    Last Pain:  Vitals:   11/12/18 1145  TempSrc:   PainSc: 4    Pain Goal:                   Edison Pace

## 2018-11-12 NOTE — Anesthesia Procedure Notes (Signed)
Epidural Patient location during procedure: OB  Staffing Anesthesiologist: Damarious Holtsclaw, MD Performed: anesthesiologist   Preanesthetic Checklist Completed: patient identified, pre-op evaluation, timeout performed, IV checked, risks and benefits discussed and monitors and equipment checked  Epidural Patient position: sitting Prep: site prepped and draped and DuraPrep Patient monitoring: heart rate, continuous pulse ox and blood pressure Approach: midline Location: L3-L4 Injection technique: LOR air and LOR saline  Needle:  Needle type: Tuohy  Needle gauge: 17 G Needle length: 9 cm Needle insertion depth: 7 cm Catheter type: closed end flexible Catheter size: 19 Gauge Catheter at skin depth: 12 cm Test dose: negative  Assessment Sensory level: T8 Events: blood not aspirated, injection not painful, no injection resistance, negative IV test and no paresthesia  Additional Notes Reason for block:procedure for pain     

## 2018-11-13 LAB — CBC
HCT: 32.7 % — ABNORMAL LOW (ref 36.0–46.0)
HEMOGLOBIN: 10.6 g/dL — AB (ref 12.0–15.0)
MCH: 28.6 pg (ref 26.0–34.0)
MCHC: 32.4 g/dL (ref 30.0–36.0)
MCV: 88.1 fL (ref 80.0–100.0)
Platelets: 231 10*3/uL (ref 150–400)
RBC: 3.71 MIL/uL — ABNORMAL LOW (ref 3.87–5.11)
RDW: 18.9 % — ABNORMAL HIGH (ref 11.5–15.5)
WBC: 9.6 10*3/uL (ref 4.0–10.5)
nRBC: 0 % (ref 0.0–0.2)

## 2018-11-13 LAB — CREATININE, SERUM
Creatinine, Ser: 0.68 mg/dL (ref 0.44–1.00)
GFR calc Af Amer: >60 mL/min (ref >60)
GFR calc non Af Amer: >60 mL/min (ref >60)

## 2018-11-13 LAB — RUBELLA SCREEN: RUBELLA: 1.5 {index} (ref >0.99)

## 2018-11-13 MED ORDER — SERTRALINE HCL 50 MG PO TABS: 50.0000 mg | ORAL_TABLET | Freq: Every day | ORAL | Status: DC

## 2018-11-13 MED ORDER — ENOXAPARIN SODIUM 40 MG/0.4ML ~~LOC~~ SOLN
40.0000 mg | SUBCUTANEOUS | Status: DC
  Administered 2018-11-13 – 2018-11-14 (×2): 40 mg via SUBCUTANEOUS
  Filled 2018-11-13 (×2): qty 0.4

## 2018-11-13 MED ORDER — SERTRALINE HCL 50 MG PO TABS
50.0000 mg | ORAL_TABLET | Freq: Every day | ORAL | Status: DC
  Administered 2018-11-13: 50 mg via ORAL
  Filled 2018-11-13: qty 1

## 2018-11-13 NOTE — Progress Notes (Signed)
Patient upset and crying with a pain score of an 8. Previous RN gave 5mg  oxycodone and 650mg  of tylenol at 0644 and 600mg  motrin at 0527. Patient states that her pain remains the same. Mainly feels pain in lower back and perineum. Refused hot pack for back. MD called.

## 2018-11-13 NOTE — Progress Notes (Signed)
Post Partum Day #1 Subjective: up ad lib, voiding, tolerating PO, + flatus and complaining of postpartum cramping. Denies HA, vision concerns, N/V.  Objective: Blood pressure 108/71, pulse 82, temperature 97.8 F (36.6 C), temperature source Oral, resp. rate 18, height 5\' 3"  (1.6 m), weight 87.4 kg, SpO2 98 %, unknown if currently breastfeeding.  Physical Exam:  General: alert, cooperative, appears stated age and no distress Lochia: appropriate Uterine Fundus: firm, below umbilicus Incision: N/A DVT Evaluation: No evidence of DVT seen on physical exam.  Recent Labs    11/11/18 2312  HGB 12.6  HCT 39.5    Assessment/Plan: Plan for discharge tomorrow, Breastfeeding, Lactation consult, Social Work consult and Contraception Nexplanon.   # Hepatitis C (with stage I fibrosis? on fibroscan done in 2016)   - Set up outpatient GI consult for follow up on treatment possibilities  - Peds aware  # H/O DVT/PE   - history of DVT x2 and PE in past (both provoked 2/2 OCPs & smoking, and surgery)  - Lovenox restarted, recommend therapy for 6 weeks postpartum (type pending on breastfeeding or not)  # Polysubstance Use  - in the past (previous methadone user), but is currently not on methadone or suboxone, and does not care to start these - Takes Oxy, Adderall and Xanax outpatient prior to pregnancy (and beginning of pregnancy per patient, but states she stopped when she found out she was pregnant).    LOS: 2 days   Jen Mow, DO 11/13/2018, 11:22 AM

## 2018-11-13 NOTE — Progress Notes (Addendum)
CSW received a telephone call from [place] CPS worker Thomasina Stallings ([phone]).  CPS informed CSW that CSW's report was accepted by CPS as an immediate report. Per CPS, CPS will conduct a home visit to interview [name]'s older child today and then plans to visit with [name] and FOB at the hospital afterwards.   CPS will follow-up with CSW after establishing a safety plan with family.   At this time there are barriers to infant's discharge.   Shamecca Whitebread Boyd-Gilyard, MSW, LCSW Clinical Social Work [phone] 

## 2018-11-13 NOTE — Clinical Social Work Maternal (Signed)
CLINICAL SOCIAL WORK MATERNAL/CHILD NOTE  Patient Details  Name: Ana Reyes MRN: [id number] Date of Birth: Apr 05, 1986  Date:  11/13/2018  Clinical Social Worker Initiating Note:  Laurey Arrow Date/Time: Initiated:  11/13/18/1139     Child's Name:  Ana Reyes   Biological Parents:  Mother, Father   Need for Interpreter:  None   Reason for Referral:  Current Substance Use/Substance Use During Pregnancy , Late or No Prenatal Care , Behavioral Health Concerns   Address:  [address] [place] [address]    Phone number:  [phone] (home)     Additional phone number:   Household Members/Support Persons (HM/SP):   Household Member/Support Person 1, Household Member/Support Person 2, Household Member/Support Person 3   HM/SP Name Relationship DOB or Age  HM/SP -1 Ana Reyes  FOB  08/13/1986  HM/SP -2 Ana Reyes Ante son 08/19/2007  HM/SP -3 Ana Reyes grandmother unknown  HM/SP -4        HM/SP -5        HM/SP -6        HM/SP -7        HM/SP -8          Natural Supports (not living in the home):  Extended Family, Immediate Family   Professional Supports: None   Employment: Unemployed   Type of Work:     Education:  [place] arranged:    Museum/gallery curator Resources:  Medicaid   Other Resources:  Physicist, medical , [place] Considerations Which May Impact Care:  None Reported  Strengths:  Home prepared for child , Ability to meet basic needs , Understanding of illness, Psychotropic Medications, Pediatrician chosen   Psychotropic Medications:  Zoloft      Pediatrician:    [employer] (including Technical sales engineer and surronding areas)  Pediatrician List:   [place] Other  [facility]      Pediatrician Fax Number:    Risk Factors/Current Problems:  Mental Health Concerns , Substance Use    Cognitive State:  Able to Concentrate , Alert ,  Linear Thinking    Mood/Affect:  Interested , Calm , Comfortable    CSW Assessment: CSW met with [name] in room 417 to complete an assessment for MH hx, SA hx, and limited PNC.  When CSW arrived, [name] was in bed bonding with infant as evidence by engaging in infant massages. FOB was also present and with [name]'s permission, CSW asked FOB to leave the room. [name] was polite and receptive to meeting with CSW.  However [name] did not appear to be a good historian and CSW had difficulty with following [name]'s rational for substance use during pregnancy.   CSW asked about [name]'s MH hx and [name] openly shared that [name] has suffered with anxiety/depression since she was a teenager.  [name] reported that [name] symptoms are managed by Zoloft however [name] has not been given Zoloft while inpatient.  [name] expressed a desire to have her Zoloft while inpatient.  CSW spoke with [name]'s bedside nurse and was assured that actions will taken to assist with [name]'s medication regiment.   CSW provided education regarding the baby blues period vs. perinatal mood disorders, discussed treatment and gave resources for mental health follow up if concerns arise.  CSW recommends self-evaluation during the postpartum time period using the New Mom Checklist from Postpartum Progress and encouraged  [name] to contact a medical professional if symptoms are noted at any time.  CSW assessed for safety and [name] denied SI, HI, and DV.  [name] reported having a good support team that consists of [name]'s grandmother and FOB.    CSW also asked about [name] limited PNC.  [name] reported having transportation barriers and not being able to establish care at a local OB practice for various reasons. CSW made [name] aware of hospital policy regarding limited [facility] and prenatal substance exposure.  [name] was understanding and asked several questions regarding CPS process. CSW answered [name]'s questions and informed [name] of infant's positive UDS for amphetamines. [name] stated, "I have a Rx for Addreall  that was prescribed to me about 6 months ago."  CSW asked additional questions and [name] acknowledged that [name]'s Rx for Addreall was written for a 30 day supply with no additional refills. [name] provided the same information for [name]'s Rx for Xanax and shared that Dr. Rex Kras from [facility] was the provider. Per [name], Dr. Rex Kras as since retired. CSW offered resources for SA treatment and [name] declined.  [name] denied CPS hx and is expecting CPS to contact [name] within the next 72 hours.  CSW made a report to [place] [facility]). CSW asked [name] if FOB has a SA hx and [name] stated, "I don't think so."  CSW asked about [name]'s delay to delivery after medical provider contacted [name] 9 days ago for delivery.  [name] communicated, "I am on a blood thinners and I was not going to be able to get the epidural." [name] confirmed that she did not comply with her OB's order and "Only came to the hospital because my water broke." CSW assessed for follow-up care for [name] and infant and [name] identified transportation barriers.  CSW informed [name] of Medicaid Transportation and [name] communicated being familiar with resource. [name] agreed to contact Medicaid Transportation to initiate an application.   CSW provided review of Sudden Infant Death Syndrome (SIDS) precautions. [name] responded appropriately to CSW questions and asked appropriate questions.    [name] reported having all essential items for infant and feeling prepared to parent.   At this time there are barriers to discharge.   CSW Plan/Description:  Sudden Infant Death Syndrome (SIDS) Education, Perinatal Mood and Anxiety Disorder (PMADs) Education, Other Patient/Family Education, [place], Other Information/Referral to [employer], Child Protective Service Report    Laurey Arrow, MSW, LCSW Clinical Social Work [phone]   Dimple Nanas, LCSW 11/13/2018, 1:59 PM

## 2018-11-14 MED ORDER — MEASLES, MUMPS & RUBELLA VAC IJ SOLR: 0.5000 mL | Freq: Once | INTRAMUSCULAR | 0 refills | Status: AC

## 2018-11-14 MED ORDER — PNEUMOCOCCAL VAC POLYVALENT 25 MCG/0.5ML IJ INJ: 0.5000 mL | INJECTION | INTRAMUSCULAR | 0 refills | Status: AC

## 2018-11-14 MED ORDER — TETANUS-DIPHTH-ACELL PERTUSSIS 5-2.5-18.5 LF-MCG/0.5 IM SUSP: 0.5000 mL | Freq: Once | INTRAMUSCULAR | 0 refills | Status: AC

## 2018-11-14 NOTE — Progress Notes (Signed)
CSW spoke with CPS Supervisor Karen James ([phone]) via telephone.  CPS informed CSW that there are no barriers to infant discharging to [name] and FOB.  Per CPS, CPS will continue to follow-up with family and offer resources and supports.  CSW informed CPS that infant's peds. Appointment is scheduled for 3/9 at 9am with [facility].  CPS communicated they will follow-up with [name] to ensure that infant attends appointment.  There are no barriers to infant's discharge.   Ginelle Bays Boyd-Gilyard, MSW, LCSW Clinical Social Work [phone] 

## 2018-11-14 NOTE — Discharge Instructions (Signed)
1. Follow-up with [facility] for her post partum care  2. She needs to followup with [facility] for Hep C followup and treatment  3. Infant needs to have followup and the Pediatrician made aware of Hep-C status  4. She needs to continue to take Lovenox injections and be seen by Hematology [facility]

## 2018-11-14 NOTE — Discharge Summary (Addendum)
OB Discharge Summary     Patient Name: Ana Reyes DOB: 08/19/1986 MRN: [id number] Date of admission: 11/11/2018  Delivering MD: Nicolette Bang )  Date of discharge: 11/14/2018    Admitting diagnosis: 33 y.o. female [id number] with IUP at 22w4dby second trimester USS presenting for SROM at 2100 on 3/3/202. Noted to have light meconium stained fluid in MAU with painful contractions since ROM.   Intrauterine pregnancy: 439w4d  Secondary diagnosis:  Active Problems:  -substance use disorder, h/o THC, prescription narcotics;, and benzos, aderal ; previously on methadone several years ago  -Hep C, untreated     Patient Active Problem List   Diagnosis Date Noted  . Delivery of newborn 11/14/2018  . Premature rupture of membranes 11/11/2018  . History of gastric bypass 09/16/2018  . Substance use disorder 09/15/2018  . Supervision of high-risk pregnancy with insufficient prenatal care, third trimester 09/15/2018  . Mood disorder ([place]12/06/2018  . Hepatitis C virus infection 10/08/2015  . Pulmonary embolism ([place]11/09/2005    Additional problems:  - limited PNC  -h/o PE x2, on Lovenox (last took 2 weeks ago)  -tobacco use (1/2 ppd)  -asthma, currently uses ProAir prn  -h/o gastric bypass surgery  -h/o endometriosis       Discharge diagnosis: Term Pregnancy Delivered                                                                                               Post partum procedures:1st Degree vaginal lac with repair   Complications: None  Hospital course:  Onset of Labor With Vaginal Delivery     33 y.o. [id number] 4193w4ds admitted in Latent Labor/SROM on 11/11/2018. Patient had an uncomplicated labor course requiring Pitocin for augmentation.  Membrane Rupture Time/Date: 9:30 PM ,11/11/2018   Intrapartum Procedures: Episiotomy: None [1]                                         Lacerations:  1st degree [2];Perineal [11]  Patient had a delivery of a Viable  infant. 11/12/2018  Information for the patient's newborn:  AllJalaina, Salyersrl AshAnniya3[[id number]]elivery Method: Vaginal, Spontaneous(Filed from Delivery Summary)    Pateint had a postpartum course remarkable for starting on Lovenox on PPD#1 due to her hx of provoked PE.  She is ambulating, tolerating a regular diet, passing flatus, and urinating well. Patient is discharged home in stable condition on 11/14/18.  She will continue on LVNX for DVT prophylaxis for a minimum of 6 weeks.   Physical exam  Vitals:   11/13/18 2214 11/14/18 0618  BP: 119/70 126/89  Pulse: 64 71  Resp: 17 18  Temp: 98.2 F (36.8 C) 98.1 F (36.7 C)  SpO2:      General: alert, cooperative and no distress Lochia: appropriate Uterine Fundus: firm Incision: Healing well with no significant drainage DVT Evaluation: No evidence of DVT seen on physical exam.  Labs: No results found for this or any previous visit (from the  past 24 hour(s)).   Discharge instruction: per After Visit Summary and "Baby and Me Booklet".  After visit meds:  Allergies  Allergen Reactions  . Latex Other (See Comments)    Causes redness.  . Morphine And Related Nausea Only  . Tramadol Nausea And Vomiting  . Toradol [Ketorolac Tromethamine] Rash    Tolerates ibuprofen    Allergies as of 11/14/2018      Reactions   Latex Other (See Comments)   Causes redness.   Morphine And Related Nausea Only   Tramadol Nausea And Vomiting   Toradol [ketorolac Tromethamine] Rash   Tolerates ibuprofen      Medication List    STOP taking these medications   oxyCODONE 5 MG immediate release tablet Commonly known as:  Oxy IR/ROXICODONE   prenatal multivitamin Tabs tablet     TAKE these medications   acetaminophen 500 MG tablet Commonly known as:  TYLENOL Take 1,000 mg by mouth every 6 (six) hours as needed for mild pain or headache.   albuterol (5 MG/ML) 0.5% nebulizer solution Commonly known as:  PROVENTIL Take 2.5 mg by nebulization  every 4 (four) hours as needed for wheezing or shortness of breath.   albuterol 108 (90 Base) MCG/ACT inhaler Commonly known as:  PROVENTIL HFA;VENTOLIN HFA Inhale 2 puffs into the lungs every 6 (six) hours as needed (SOB/Wheezing).   calcium carbonate 500 MG chewable tablet Commonly known as:  TUMS - dosed in mg elemental calcium Chew 2 tablets by mouth 3 (three) times daily as needed for indigestion or heartburn.   cyclobenzaprine 5 MG tablet Commonly known as:  FLEXERIL Take 5 mg by mouth 3 (three) times daily as needed for muscle spasms.   enoxaparin 40 MG/0.4ML injection Commonly known as:  LOVENOX Inject 40 mg into the skin daily.   ferrous sulfate 325 (65 FE) MG tablet Take 325 mg by mouth daily with breakfast.   Fluticasone-Salmeterol 500-50 MCG/DOSE Aepb Commonly known as:  ADVAIR Inhale 2 puffs into the lungs daily.   gabapentin 300 MG capsule Commonly known as:  NEURONTIN Take 600 mg by mouth 3 (three) times daily.   measles, mumps & rubella vaccine injection Commonly known as:  MMR Inject 0.5 mLs into the skin once for 1 dose.   pneumococcal 23 valent vaccine 25 MCG/0.5ML injection Commonly known as:  PNU-IMMUNE Inject 0.5 mLs into the muscle tomorrow at 10 am for 1 dose.   promethazine 25 MG tablet Commonly known as:  PHENERGAN Take 25 mg by mouth every 8 (eight) hours as needed for nausea or vomiting.   sertraline 50 MG tablet Commonly known as:  ZOLOFT Take 50 mg by mouth daily.   Tdap 5-2.5-18.5 LF-MCG/0.5 injection Commonly known as:  BOOSTRIX Inject 0.5 mLs into the muscle once for 1 dose.        Diet: routine diet  Activity: Advance as tolerated. Pelvic rest for 6 weeks.   Outpatient follow up:4 weeks Future Appointments: No future appointments.  Follow up Appt: No follow-ups on file.   Postpartum contraception: Nexplanon  Newborn Data: APGAR (1 MIN): 8   APGAR (5 MINS): 9    Baby Feeding: Bottle Disposition:CPS followup is  required    Druscilla Brownie MD PGY 1 Family Medicine Resident Arnold Line  11/14/2018   CNM attestation I have seen and examined this patient and agree with above documentation in the resident's note.   BUFORD GAYLER is a 33 y.o. [id number] s/p SVD.   Pain is well controlled.  Plan for birth control is Nexplanon.  Method of Feeding: breast  PE:  BP 126/89 (BP Location: Right Arm)   Pulse 71   Temp 98.1 F (36.7 C) (Oral)   Resp 18   Ht '5\' 3"'$  (1.6 m)   Wt 87.4 kg   SpO2 98%   Breastfeeding Unknown   BMI 34.12 kg/m  Fundus firm  Recent Labs    11/11/18 2312 11/13/18 1243  HGB 12.6 10.6*  HCT 39.5 32.7*     Plan: discharge today - postpartum care discussed - f/u clinic in 4 weeks for postpartum visit- recommended that she call her [facility] OB provider ASAP to inform them of her delivery and the need to f/u with GI for her Hep C treatment.  Myrtis Ser CNM 11/14/2018 5:35 PM

## 2018-11-14 NOTE — Care Management Note (Signed)
Case Management Note  Patient Details  Name: RHYLIN ANSON MRN: [id number] Date of Birth: September 08, 1986  Subjective/Objective:     33 year old female admitted 11/11/18 with ROM S/P SVD.               Action/Plan:D/C when medically stable.            Expected Discharge Plan:  Home/Self Care  In-House Referral:  Clinical Social Work  Discharge planning Services  CM Consult  Status of Service:  Completed, signed off  Additional Comments:CM received consult.  Pt has active Medicaid for medication needed at discharge.  Damyra Luscher RNC-MNN, BSN 11/14/2018, 8:08 AM

## 2018-11-19 ENCOUNTER — Inpatient Hospital Stay (HOSPITAL_COMMUNITY)
Admission: AD | Admit: 2018-11-19 | Discharge: 2018-11-21 | DRG: 776 | Disposition: A | Discharge status: Home / Self Care | Payer: Medicaid Other | Attending: Obstetrics and Gynecology | Admitting: Obstetrics and Gynecology

## 2018-11-19 ENCOUNTER — Other Ambulatory Visit: Payer: Self-pay

## 2018-11-19 DIAGNOSIS — O152 Eclampsia in the puerperium: Secondary | ICD-10-CM | POA: Diagnosis present

## 2018-11-19 DIAGNOSIS — O99335 Smoking (tobacco) complicating the puerperium: Secondary | ICD-10-CM | POA: Diagnosis present

## 2018-11-19 DIAGNOSIS — O99845 Bariatric surgery status complicating the puerperium: Secondary | ICD-10-CM | POA: Diagnosis not present

## 2018-11-19 DIAGNOSIS — Z86711 Personal history of pulmonary embolism: Secondary | ICD-10-CM | POA: Diagnosis present

## 2018-11-19 DIAGNOSIS — O9843 Viral hepatitis complicating the puerperium: Secondary | ICD-10-CM | POA: Diagnosis not present

## 2018-11-19 DIAGNOSIS — F17210 Nicotine dependence, cigarettes, uncomplicated: Secondary | ICD-10-CM | POA: Diagnosis present

## 2018-11-19 DIAGNOSIS — B1920 Unspecified viral hepatitis C without hepatic coma: Secondary | ICD-10-CM | POA: Diagnosis present

## 2018-11-19 MED ORDER — HYDRALAZINE HCL 20 MG/ML IJ SOLN: 5.0000 mg | INTRAMUSCULAR | Status: DC | PRN

## 2018-11-19 MED ORDER — LACTATED RINGERS IV SOLN
INTRAVENOUS | Status: DC
  Administered 2018-11-19 – 2018-11-20 (×2): via INTRAVENOUS

## 2018-11-19 MED ORDER — OXYCODONE-ACETAMINOPHEN 5-325 MG PO TABS
1.0000 | ORAL_TABLET | Freq: Four times a day (QID) | ORAL | Status: DC | PRN
  Administered 2018-11-19 – 2018-11-21 (×7): 1 via ORAL
  Filled 2018-11-19 (×7): qty 1

## 2018-11-19 MED ORDER — MAGNESIUM SULFATE 40 G IN LACTATED RINGERS - SIMPLE
2.0000 g/h | INTRAVENOUS | Status: AC
  Administered 2018-11-19 – 2018-11-20 (×2): 2 g/h via INTRAVENOUS
  Filled 2018-11-19: qty 500

## 2018-11-19 MED ORDER — ZOLPIDEM TARTRATE 5 MG PO TABS: 5.0000 mg | ORAL_TABLET | Freq: Every evening | ORAL | Status: DC | PRN

## 2018-11-19 MED ORDER — HYDRALAZINE HCL 20 MG/ML IJ SOLN: 10.0000 mg | INTRAMUSCULAR | Status: DC | PRN

## 2018-11-19 MED ORDER — ALBUTEROL SULFATE (2.5 MG/3ML) 0.083% IN NEBU
3.0000 mL | INHALATION_SOLUTION | RESPIRATORY_TRACT | Status: DC | PRN
  Administered 2018-11-19 – 2018-11-20 (×2): 3 mL via RESPIRATORY_TRACT
  Filled 2018-11-19 (×3): qty 3

## 2018-11-19 MED ORDER — LABETALOL HCL 5 MG/ML IV SOLN: 40.0000 mg | INTRAVENOUS | Status: DC | PRN

## 2018-11-19 MED ORDER — LABETALOL HCL 5 MG/ML IV SOLN: 20.0000 mg | INTRAVENOUS | Status: DC | PRN

## 2018-11-19 MED ORDER — MAGNESIUM SULFATE 40 G IN LACTATED RINGERS - SIMPLE
INTRAVENOUS | Status: AC
  Administered 2018-11-19: 2 g/h via INTRAVENOUS
  Filled 2018-11-19: qty 500

## 2018-11-19 MED ORDER — ENOXAPARIN SODIUM 40 MG/0.4ML ~~LOC~~ SOLN
40.0000 mg | SUBCUTANEOUS | Status: DC
  Administered 2018-11-19 – 2018-11-20 (×2): 40 mg via SUBCUTANEOUS
  Filled 2018-11-19 (×2): qty 0.4

## 2018-11-19 MED ORDER — IBUPROFEN 600 MG PO TABS
600.0000 mg | ORAL_TABLET | Freq: Four times a day (QID) | ORAL | Status: DC | PRN
  Administered 2018-11-20 – 2018-11-21 (×3): 600 mg via ORAL
  Filled 2018-11-19 (×3): qty 1

## 2018-11-19 NOTE — H&P (Signed)
Ana Reyes is an 33 y.o. female [phone] PPD # 7 who had 2 witness general tonic sz today at 0400 and 0600. No urinary incont. Each lasted < 1 min. Went to PCP who sent her to ER. W/U in ER - CXR, nl LFT's, UA- protein. Received 4 gm Magnesium and transferred to [facility] for further eval and management.  Pt reports HA for the last 2 days, resolved with Goody Powders. Denies any H/O Sz or HTN prior to today's events.   H/O PE x 2 and on Lovenox 40 mg qd, last dose yesterday evening. H/O substance abuse, Hep C and H/O gastric bypass   Menstrual History: Menarche age: 89 No LMP recorded.    Past Medical History:  Diagnosis Date  . Asthma   . Cervical dysplasia   . Endometriosis   . Kidney stones     Past Surgical History:  Procedure Laterality Date  . CHOLECYSTECTOMY    . GASTRIC BYPASS    . LAPAROSCOPY      No family history on file.  Social History:  reports that she has been smoking cigarettes. She has been smoking about 1.00 pack per day. She does not have any smokeless tobacco history on file. She reports that she does not drink alcohol or use drugs.  Allergies:  Allergies  Allergen Reactions  . Latex Other (See Comments)    Causes redness.  . Morphine And Related Nausea Only  . Tramadol Nausea And Vomiting  . Toradol [Ketorolac Tromethamine] Rash    Tolerates ibuprofen    Medications Prior to Admission  Medication Sig Dispense Refill Last Dose  . acetaminophen (TYLENOL) 500 MG tablet Take 1,000 mg by mouth every 6 (six) hours as needed for mild pain or headache.   11/11/2018 at Unknown time  . albuterol (PROVENTIL HFA;VENTOLIN HFA) 108 (90 BASE) MCG/ACT inhaler Inhale 2 puffs into the lungs every 6 (six) hours as needed (SOB/Wheezing).   Past Week at Unknown time  . albuterol (PROVENTIL) (5 MG/ML) 0.5% nebulizer solution Take 2.5 mg by nebulization every 4 (four) hours as needed for wheezing or shortness of breath.   11/11/2018 at Unknown time  . calcium carbonate (TUMS  - DOSED IN MG ELEMENTAL CALCIUM) 500 MG chewable tablet Chew 2 tablets by mouth 3 (three) times daily as needed for indigestion or heartburn.   11/11/2018 at Unknown time  . cyclobenzaprine (FLEXERIL) 5 MG tablet Take 5 mg by mouth 3 (three) times daily as needed for muscle spasms.   Past Week at Unknown time  . enoxaparin (LOVENOX) 40 MG/0.4ML injection Inject 40 mg into the skin daily.    Past Month at Unknown time  . ferrous sulfate 325 (65 FE) MG tablet Take 325 mg by mouth daily with breakfast.   11/11/2018 at Unknown time  . Fluticasone-Salmeterol (ADVAIR) 500-50 MCG/DOSE AEPB Inhale 2 puffs into the lungs daily.   11/11/2018 at Unknown time  . gabapentin (NEURONTIN) 300 MG capsule Take 600 mg by mouth 3 (three) times daily.   11/11/2018 at Unknown time  . promethazine (PHENERGAN) 25 MG tablet Take 25 mg by mouth every 8 (eight) hours as needed for nausea or vomiting.    Past Week at Unknown time  . sertraline (ZOLOFT) 50 MG tablet Take 50 mg by mouth daily.    11/11/2018 at Unknown time    Review of Systems  Constitutional: Negative.   Eyes: Negative.   Respiratory: Negative.   Cardiovascular: Negative.   Gastrointestinal: Negative.   Neurological:  Negative.     Blood pressure 122/86, pulse 93, temperature 99.1 F (37.3 C), temperature source Oral, resp. rate 16, height 5\' 3"  (1.6 m), weight 78.9 kg, SpO2 99 %, unknown if currently breastfeeding. Physical Exam  Constitutional: She is oriented to person, place, and time. She appears well-developed and well-nourished.  Eyes: Pupils are equal, round, and reactive to light. EOM are normal.  Neck: Normal range of motion. Neck supple.  Cardiovascular: Normal rate and regular rhythm.  Respiratory: Effort normal and breath sounds normal.  GI: Soft. Bowel sounds are normal.  U-2  Musculoskeletal: Normal range of motion.        General: Edema present.  Neurological: She is alert and oriented to person, place, and time.  DTR's + 3    No results  found for this or any previous visit (from the past 24 hour(s)).  No results found.  Assessment/Plan: Postpartum Eclampsia H/O PE  Hep C H/O substance abuse  DX discussed with pt. Will start Magnesium drip at 2 gm/hr. Continue with Lovenox and supportive care. Brain MRI to R/O PRES. POC reviewed with pt. Pt verbalized understanding.   Hermina Staggers 11/19/2018, 8:38 PM

## 2018-11-20 ENCOUNTER — Observation Stay (HOSPITAL_COMMUNITY): Payer: Medicaid Other

## 2018-11-20 MED ORDER — ENALAPRIL MALEATE 5 MG PO TABS
5.0000 mg | ORAL_TABLET | Freq: Every day | ORAL | Status: DC
  Administered 2018-11-20 – 2018-11-21 (×2): 5 mg via ORAL
  Filled 2018-11-20 (×2): qty 1

## 2018-11-20 MED ORDER — GADOBUTROL 1 MMOL/ML IV SOLN
10.0000 mL | Freq: Once | INTRAVENOUS | Status: AC | PRN
  Administered 2018-11-20: 7.9 mL via INTRAVENOUS

## 2018-11-20 NOTE — Progress Notes (Signed)
Post Partum Day 8 Subjective: Patient is doing well without complaints. She denies headaches, visual changes, RUQ/epigastric pain  Objective: Blood pressure 123/76, pulse 86, temperature 98 F (36.7 C), temperature source Oral, resp. rate 18, height 5\' 3"  (1.6 m), weight 78.9 kg, SpO2 96 %, unknown if currently breastfeeding.  Physical Exam:  General: alert, cooperative and no distress Lochia: appropriate Uterine Fundus: firm DVT Evaluation: No evidence of DVT seen on physical exam.  No results for input(s): HGB, HCT in the last 72 hours.  Assessment/Plan: Will continue magnesium sulfate for 24 hours total Enalapril started given a few elevated BP (135-145/82-100) Follow up MRI Continue current care   LOS: 1 day   Nikkol Pai 11/20/2018, 11:40 AM

## 2018-11-20 NOTE — Progress Notes (Signed)
Magnesium d/c'd per order. Pt denies any HA, epigastric pain or blurred vision. BP 126/88. Carmelina Dane, RN

## 2018-11-21 DIAGNOSIS — O99845 Bariatric surgery status complicating the puerperium: Secondary | ICD-10-CM | POA: Diagnosis present

## 2018-11-21 DIAGNOSIS — F17210 Nicotine dependence, cigarettes, uncomplicated: Secondary | ICD-10-CM | POA: Diagnosis present

## 2018-11-21 DIAGNOSIS — O9843 Viral hepatitis complicating the puerperium: Secondary | ICD-10-CM | POA: Diagnosis present

## 2018-11-21 DIAGNOSIS — B1920 Unspecified viral hepatitis C without hepatic coma: Secondary | ICD-10-CM | POA: Diagnosis present

## 2018-11-21 DIAGNOSIS — Z86711 Personal history of pulmonary embolism: Secondary | ICD-10-CM | POA: Diagnosis not present

## 2018-11-21 DIAGNOSIS — O152 Eclampsia in the puerperium: Secondary | ICD-10-CM | POA: Diagnosis present

## 2018-11-21 DIAGNOSIS — O99335 Smoking (tobacco) complicating the puerperium: Secondary | ICD-10-CM | POA: Diagnosis present

## 2018-11-21 LAB — CBC
HCT: 36.3 % (ref 36.0–46.0)
Hemoglobin: 11.7 g/dL — ABNORMAL LOW (ref 12.0–15.0)
MCH: 28.2 pg (ref 26.0–34.0)
MCHC: 32.2 g/dL (ref 30.0–36.0)
MCV: 87.5 fL (ref 80.0–100.0)
NRBC: 0 % (ref 0.0–0.2)
Platelets: 424 10*3/uL — ABNORMAL HIGH (ref 150–400)
RBC: 4.15 MIL/uL (ref 3.87–5.11)
RDW: 18.2 % — ABNORMAL HIGH (ref 11.5–15.5)
WBC: 5.3 10*3/uL (ref 4.0–10.5)

## 2018-11-21 LAB — COMPREHENSIVE METABOLIC PANEL
ALBUMIN: 2.7 g/dL — AB (ref 3.5–5.0)
ALT: 15 U/L (ref 0–44)
AST: 19 U/L (ref 15–41)
Alkaline Phosphatase: 172 U/L — ABNORMAL HIGH (ref 38–126)
Anion gap: 8 (ref 5–15)
BUN: 5 mg/dL — AB (ref 6–20)
CO2: 21 mmol/L — ABNORMAL LOW (ref 22–32)
Calcium: 7 mg/dL — ABNORMAL LOW (ref 8.9–10.3)
Chloride: 109 mmol/L (ref 98–111)
Creatinine, Ser: 0.62 mg/dL (ref 0.44–1.00)
GFR calc Af Amer: >60 mL/min (ref >60)
GFR calc non Af Amer: >60 mL/min (ref >60)
Glucose, Bld: 110 mg/dL — ABNORMAL HIGH (ref 70–99)
Potassium: 3.6 mmol/L (ref 3.5–5.1)
Sodium: 138 mmol/L (ref 135–145)
Total Bilirubin: 0.2 mg/dL — ABNORMAL LOW (ref 0.3–1.2)
Total Protein: 6.4 g/dL — ABNORMAL LOW (ref 6.5–8.1)

## 2018-11-21 MED ORDER — ENALAPRIL MALEATE 5 MG PO TABS: 5.0000 mg | ORAL_TABLET | Freq: Every day | ORAL | 1 refills | Status: DC

## 2018-11-21 MED ORDER — NICOTINE 14 MG/24HR TD PT24: 14.0000 mg | MEDICATED_PATCH | Freq: Every day | TRANSDERMAL | 3 refills | Status: DC
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
## 2018-11-21 NOTE — Discharge Instructions (Signed)
.  Preeclampsia and Eclampsia    Preeclampsia is a serious condition that may develop during pregnancy. It is also called toxemia of pregnancy. This condition causes high blood pressure along with other symptoms, such as swelling and headaches. These symptoms may develop as the condition gets worse. Preeclampsia may occur at 20 weeks of pregnancy or later.  Diagnosing and treating preeclampsia early is very important. If not treated early, it can cause serious problems for you and your baby. One problem it can lead to is eclampsia. Eclampsia is a condition that causes muscle jerking or shaking (convulsions or seizures) and other serious problems for the mother. During pregnancy, delivering your baby may be the best treatment for preeclampsia or eclampsia. For most women, preeclampsia and eclampsia symptoms go away after giving birth.  In rare cases, a woman may develop preeclampsia after giving birth (postpartum preeclampsia). This usually occurs within 48 hours after childbirth but may occur up to 6 weeks after giving birth.  What are the causes?  The cause of preeclampsia is not known.  What increases the risk?  The following risk factors make you more likely to develop preeclampsia:   Being pregnant for the first time.   Having had preeclampsia during a past pregnancy.   Having a family history of preeclampsia.   Having high blood pressure.   Being pregnant with more than one baby.   Being 35 or older.   Being African-American.   Having kidney disease or diabetes.   Having medical conditions such as lupus or blood diseases.   Being very overweight (obese).  What are the signs or symptoms?  The earliest signs of preeclampsia are:   High blood pressure.   Increased protein in your urine. Your health care provider will check for this at every visit before you give birth (prenatal visit).  Other symptoms that may develop as the condition gets worse include:   Severe headaches.   Sudden weight  gain.   Swelling of the hands, face, legs, and feet.   Nausea and vomiting.   Vision problems, such as blurred or double vision.   Numbness in the face, arms, legs, and feet.   Urinating less than usual.   Dizziness.   Slurred speech.   Abdominal pain, especially upper abdominal pain.   Convulsions or seizures.  How is this diagnosed?  There are no screening tests for preeclampsia. Your health care provider will ask you about symptoms and check for signs of preeclampsia during your prenatal visits. You may also have tests that include:   Urine tests.   Blood tests.   Checking your blood pressure.   Monitoring your baby's heart rate.   Ultrasound.  How is this treated?  You and your health care provider will determine the treatment approach that is best for you. Treatment may include:   Having more frequent prenatal exams to check for signs of preeclampsia, if you have an increased risk for preeclampsia.   Medicine to lower your blood pressure.   Staying in the hospital, if your condition is severe. There, treatment will focus on controlling your blood pressure and the amount of fluids in your body (fluid retention).   Taking medicine (magnesium sulfate) to prevent seizures. This may be given as an injection or through an IV.   Taking a low-dose aspirin during your pregnancy.   Delivering your baby early, if your condition gets worse. You may have your labor started with medicine (induced), or you may have a cesarean   delivery.  Follow these instructions at home:  Eating and drinking     Drink enough fluid to keep your urine pale yellow.   Avoid caffeine.  Lifestyle   Do not use any products that contain nicotine or tobacco, such as cigarettes and e-cigarettes. If you need help quitting, ask your health care provider.   Do not use alcohol or drugs.   Avoid stress as much as possible. Rest and get plenty of sleep.  General instructions   Take over-the-counter and prescription medicines only as  told by your health care provider.   When lying down, lie on your left side. This keeps pressure off your major blood vessels.   When sitting or lying down, raise (elevate) your feet. Try putting some pillows underneath your lower legs.   Exercise regularly. Ask your health care provider what kinds of exercise are best for you.   Keep all follow-up and prenatal visits as told by your health care provider. This is important.  How is this prevented?  There is no known way of preventing preeclampsia or eclampsia from developing. However, to lower your risk of complications and detect problems early:   Get regular prenatal care. Your health care provider may be able to diagnose and treat the condition early.   Maintain a healthy weight. Ask your health care provider for help managing weight gain during pregnancy.   Work with your health care provider to manage any long-term (chronic) health conditions you have, such as diabetes or kidney problems.   You may have tests of your blood pressure and kidney function after giving birth.   Your health care provider may have you take low-dose aspirin during your next pregnancy.  Contact a health care provider if:   You have symptoms that your health care provider told you may require more treatment or monitoring, such as:  ? Headaches.  ? Nausea or vomiting.  ? Abdominal pain.  ? Dizziness.  ? Light-headedness.  Get help right away if:   You have severe:  ? Abdominal pain.  ? Headaches that do not get better.  ? Dizziness.  ? Vision problems.  ? Confusion.  ? Nausea or vomiting.   You have any of the following:  ? A seizure.  ? Sudden, rapid weight gain.  ? Sudden swelling in your hands, ankles, or face.  ? Trouble moving any part of your body.  ? Numbness in any part of your body.  ? Trouble speaking.  ? Abnormal bleeding.   You faint.  Summary   Preeclampsia is a serious condition that may develop during pregnancy. It is also called toxemia of pregnancy.   This  condition causes high blood pressure along with other symptoms, such as swelling and headaches.   Diagnosing and treating preeclampsia early is very important. If not treated early, it can cause serious problems for you and your baby.   Get help right away if you have symptoms that your health care provider told you to watch for.  This information is not intended to replace advice given to you by your health care provider. Make sure you discuss any questions you have with your health care provider.  Document Released: 08/24/2000 Document Revised: 08/13/2017 Document Reviewed: 04/02/2016  Elsevier Interactive Patient Education  2019 [employer]

## 2018-11-21 NOTE — Discharge Summary (Signed)
OB Discharge Summary     Patient Name: Ana Reyes DOB: July 18, 1986 MRN: [id number]  Date of admission: 11/19/2018 Delivering MD: This patient has no babies on file.  Date of discharge: 11/21/2018  Admitting diagnosis: pp, magnesium therapy Intrauterine pregnancy: Unknown     Secondary diagnosis:  Principal Problem:   Eclampsia, postpartum Active Problems:   History of pulmonary embolus (PE)      Discharge diagnosis: postpartum eclampsia stable                                                                                                Hospital course:  Patient admitted following 2 witnessed seizure at home. [facility] on magnesium sulfate for seizure prophylaxis and enalapril 5 mg. Patient remains asymptomatic during her stay. Brain MRI was negative for PRESS syndrome. Patient found stable for discharge. Discharge instructions reviewed with the patient. Follow up appointments for BP check and postpartum visit scheduled. Patient with history of pulmonary emboli and was continued on lovenox throughout her stay  Physical exam  Vitals:   11/20/18 2151 11/20/18 2327 11/21/18 0340 11/21/18 0834  BP:  137/75 (!) 144/80 (!) 143/81  Pulse:  70 80 63  Resp:  18 18 18   Temp:  98.1 F (36.7 C) 99.1 F (37.3 C) 98.3 F (36.8 C)  TempSrc:  Oral Oral Oral  SpO2: 98% 100% 97% 97%  Weight:      Height:       General: alert, cooperative and no distress Lochia: appropriate Uterine Fundus: firm DVT Evaluation: No evidence of DVT seen on physical exam. Labs: Lab Results  Component Value Date   WBC 5.3 11/21/2018   HGB 11.7 (L) 11/21/2018   HCT 36.3 11/21/2018   MCV 87.5 11/21/2018   PLT 424 (H) 11/21/2018   CMP Latest Ref Rng & Units 11/21/2018  Glucose 70 - 99 mg/dL 962(E)  BUN 6 - 20 mg/dL 5(L)  Creatinine 3.66 - 1.00 mg/dL 2.94  Sodium 765 - 465 mmol/L 138  Potassium 3.5 - 5.1 mmol/L 3.6  Chloride 98 - 111 mmol/L 109  CO2 22 -  32 mmol/L 21(L)  Calcium 8.9 - 10.3 mg/dL 7.0(L)  Total Protein 6.5 - 8.1 g/dL 6.4(L)  Total Bilirubin 0.3 - 1.2 mg/dL 0.3(T)  Alkaline Phos 38 - 126 U/L 172(H)  AST 15 - 41 U/L 19  ALT 0 - 44 U/L 15    Discharge instruction: per After Visit Summary and "Baby and Me Booklet".  After visit meds:  Allergies as of 11/21/2018      Reactions   Latex Other (See Comments)   Causes redness.   Morphine And Related Nausea Only   Tramadol Nausea And Vomiting   Toradol [ketorolac Tromethamine] Rash   Tolerates ibuprofen      Medication List    TAKE these medications   acetaminophen 500 MG tablet Commonly known as:  TYLENOL Take 1,000 mg by mouth every 6 (six) hours as needed for mild pain or headache.   albuterol (5 MG/ML) 0.5% nebulizer solution Commonly known as:  PROVENTIL Take  2.5 mg by nebulization every 4 (four) hours as needed for wheezing or shortness of breath.   albuterol 108 (90 Base) MCG/ACT inhaler Commonly known as:  PROVENTIL HFA;VENTOLIN HFA Inhale 2 puffs into the lungs every 6 (six) hours as needed (SOB/Wheezing).   ALPRAZolam 1 MG tablet Commonly known as:  XANAX Take 1 mg by mouth 2 (two) times daily as needed for anxiety.   calcium carbonate 500 MG chewable tablet Commonly known as:  TUMS - dosed in mg elemental calcium Chew 2 tablets by mouth 3 (three) times daily as needed for indigestion or heartburn.   cyclobenzaprine 5 MG tablet Commonly known as:  FLEXERIL Take 5 mg by mouth 3 (three) times daily as needed for muscle spasms.   enalapril 5 MG tablet Commonly known as:  VASOTEC Take 1 tablet (5 mg total) by mouth daily.   enoxaparin 40 MG/0.4ML injection Commonly known as:  LOVENOX Inject 40 mg into the skin daily.   Fluticasone-Salmeterol 500-50 MCG/DOSE Aepb Commonly known as:  ADVAIR Inhale 2 puffs into the lungs daily.   gabapentin 300 MG capsule Commonly known as:  NEURONTIN Take 600 mg by mouth 3 (three) times daily.   nicotine 14  mg/24hr patch Commonly known as:  NICODERM CQ - dosed in mg/24 hours Place 1 patch (14 mg total) onto the skin daily.   oxyCODONE-acetaminophen 5-325 MG tablet Commonly known as:  PERCOCET/ROXICET Take 1 tablet by mouth 3 (three) times daily.       Diet: routine diet  Activity: Advance as tolerated. Pelvic rest for 6 weeks.   Outpatient follow up: 1 week Follow up Appt:No future appointments. Follow up Visit:No follow-ups on file.  Postpartum contraception: Undecided   11/21/2018 Catalina Antigua, MD

## 2018-12-01 ENCOUNTER — Telehealth: Payer: Self-pay | Admitting: *Deleted

## 2018-12-01 NOTE — Telephone Encounter (Signed)
Ana Reyes called and left a message stating she needs to talk to someone about follow up care for eclampsia

## 2018-12-01 NOTE — Telephone Encounter (Signed)
I called Ana Reyes and we discussed she needed one week follow up after her discharge from having ecclampsia/ delivery . She states she has scheduled with the doctor that sent her to the hospital that is closer to her at [facility] .I advised her to be sure they know she had ecclampsia/ delivered.We discussed if her blood pressure is not normal yet may need to adjust her medications and schedule another blood pressure check. We also discussed she has a postpartum visit with [place] already scheduled. I also advised her she is still at risk to  Have pre-eclampsia/ ecclampsia again and if she begins to have increased edema or severe headache unrelieved by tylenol or visual changes to go to the hospital. She voices understanding.

## 2018-12-24 ENCOUNTER — Telehealth: Payer: Self-pay | Admitting: Family Medicine

## 2018-12-24 NOTE — Telephone Encounter (Signed)
Patient called and informed about her appointment.

## 2018-12-25 ENCOUNTER — Ambulatory Visit: Payer: Medicaid Other | Admitting: Obstetrics and Gynecology

## 2018-12-26 ENCOUNTER — Telehealth: Payer: Self-pay | Admitting: Obstetrics & Gynecology

## 2018-12-26 NOTE — Telephone Encounter (Signed)
Called the patient to inform of upcoming appointment. No questions at this time. °

## 2018-12-29 ENCOUNTER — Ambulatory Visit: Payer: Medicaid Other | Admitting: Family Medicine

## 2018-12-29 ENCOUNTER — Telehealth: Payer: Self-pay | Admitting: Family Medicine

## 2018-12-29 NOTE — Telephone Encounter (Signed)
The patient called in to rescheduled the appointment as she is running a fever.

## 2019-01-12 ENCOUNTER — Telehealth: Payer: Self-pay | Admitting: Obstetrics and Gynecology

## 2019-01-12 ENCOUNTER — Ambulatory Visit: Payer: Medicaid Other | Admitting: Family Medicine

## 2019-01-12 NOTE — Telephone Encounter (Signed)
The patient stated she is in the middle of making funeral arrangements for her father-n-law. She requested the appointment be rescheduled. Informed of the upcoming appointment and the physician she will be visiting with. Also sending an appointment reminder.

## 2019-01-26 ENCOUNTER — Telehealth: Payer: Self-pay | Admitting: Obstetrics and Gynecology

## 2019-01-26 NOTE — Telephone Encounter (Signed)
Called the patient to inform of the upcoming visit, new location, and wearing a face mask to the visit. Also educated of no visitors due to COVID19 restrictions.

## 2019-01-27 ENCOUNTER — Encounter: Payer: Self-pay | Admitting: Student

## 2019-01-27 ENCOUNTER — Other Ambulatory Visit: Payer: Self-pay

## 2019-01-27 ENCOUNTER — Ambulatory Visit (INDEPENDENT_AMBULATORY_CARE_PROVIDER_SITE_OTHER): Payer: Medicaid Other | Admitting: Student

## 2019-01-27 VITALS — BP 88/52 | HR 112 | Ht 63.0 in | Wt 164.0 lb

## 2019-01-27 DIAGNOSIS — N899 Noninflammatory disorder of vagina, unspecified: Secondary | ICD-10-CM

## 2019-01-27 DIAGNOSIS — Z1389 Encounter for screening for other disorder: Secondary | ICD-10-CM

## 2019-01-27 DIAGNOSIS — R4589 Other symptoms and signs involving emotional state: Secondary | ICD-10-CM | POA: Insufficient documentation

## 2019-01-27 DIAGNOSIS — O9089 Other complications of the puerperium, not elsewhere classified: Secondary | ICD-10-CM

## 2019-01-27 LAB — POCT PREGNANCY, URINE: Preg Test, Ur: NEGATIVE

## 2019-01-27 MED ORDER — GABAPENTIN 300 MG PO CAPS: 600.0000 mg | ORAL_CAPSULE | Freq: Three times a day (TID) | ORAL | 1 refills | Status: DC

## 2019-01-27 MED ORDER — NICOTINE 14 MG/24HR TD PT24: 14.0000 mg | MEDICATED_PATCH | Freq: Every day | TRANSDERMAL | 3 refills | Status: DC

## 2019-01-27 MED ORDER — NORETHINDRONE 0.35 MG PO TABS: 1.0000 | ORAL_TABLET | Freq: Every day | ORAL | 11 refills | Status: DC

## 2019-01-27 NOTE — Progress Notes (Signed)
Subjective:     Ana Reyes is a 33 y.o. female who presents for a postpartum visit. She is 10 weeks postpartum following a spontaneous vaginal delivery. I have fully reviewed the prenatal and intrapartum course. The delivery was at 41/4 gestational weeks. Outcome: spontaneous vaginal delivery. Anesthesia: epidural. Postpartum course has been uneventful. She is still taking her BP meds and her gabapentin. She has been having a lot of problems with her partner; she is tearful because he says that he is not attracted to her anymore due to the changes her vaginal tissues. She denies leaking urine, stress incontinence or urge overflow.  Baby's course has been uneventful. Baby is feeding by breast. Bleeding no bleeding. Bowel function is normal. Bladder function is normal. Patient is sexually active. Contraception method is condoms. Postpartum depression screening: negative.  The following portions of the patient's history were reviewed and updated as appropriate: allergies, current medications, past family history, past medical history, past social history, past surgical history and problem list.  Review of Systems Pertinent items are noted in HPI.   Objective:    BP (!) 88/52   Pulse (!) 112   Ht 5\' 3"  (1.6 m)   Wt 164 lb (74.4 kg)   BMI 29.05 kg/m   General:  alert, cooperative and mild distress   Breasts:  inspection negative, no nipple discharge or bleeding, no masses or nodularity palpable  Lungs: clear to auscultation bilaterally  Heart:  regular rate and rhythm, S1, S2 normal, no murmur, click, rub or gallop  Abdomen: soft, non-tender; bowel sounds normal; no masses,  no organomegaly   Vulva:  not evaluated  Vagina: not evaluated  Cervix:  not evaluated  Corpus: not examined  Adnexa:  not evaluated  Rectal Exam: Not performed.        Assessment:     Normal postpartum exam. Pap smear not done at today's visit.   Plan:    1. Contraception: oral progesterone-only  contraceptive 2.  Patient no longer taking her Lovenox, she is taking her gabapentin and vasotec. Patient would like to start POP; prefers this over the Nexplanon.  3. Referral made to Pelvic floor PT  3. Follow up in: PRN as needed or as needed.

## 2019-01-27 NOTE — Patient Instructions (Signed)
Norethindrone tablets (contraception)  What is this medicine?  NORETHINDRONE (nor eth IN drone) is an oral contraceptive. The product contains a female hormone known as a progestin. It is used to prevent pregnancy.  This medicine may be used for other purposes; ask your health care provider or pharmacist if you have questions.  COMMON BRAND NAME(S): Camila, Deblitane 28-Day, Errin, Heather, Jencycla, Jolivette, Lyza, Nor-QD, Nora-BE, Norlyroc, Ortho Micronor, Sharobel 28-Day  What should I tell my health care provider before I take this medicine?  They need to know if you have any of these conditions:  -blood vessel disease or blood clots  -breast, cervical, or vaginal cancer  -diabetes  -heart disease  -kidney disease  -liver disease  -mental depression  -migraine  -seizures  -stroke  -vaginal bleeding  -an unusual or allergic reaction to norethindrone, other medicines, foods, dyes, or preservatives  -pregnant or trying to get pregnant  -breast-feeding  How should I use this medicine?  Take this medicine by mouth with a glass of water. You may take it with or without food. Follow the directions on the prescription label. Take this medicine at the same time each day and in the order directed on the package. Do not take your medicine more often than directed.  Contact your pediatrician regarding the use of this medicine in children. Special care may be needed. This medicine has been used in female children who have started having menstrual periods.  A patient package insert for the product will be given with each prescription and refill. Read this sheet carefully each time. The sheet may change frequently.  Overdosage: If you think you have taken too much of this medicine contact a poison control center or emergency room at once.  NOTE: This medicine is only for you. Do not share this medicine with others.  What if I miss a dose?  Try not to miss a dose. Every time you miss a dose or take a dose late your chance of  pregnancy increases. When 1 pill is missed (even if only 3 hours late), take the missed pill as soon as possible and continue taking a pill each day at the regular time (use a back up method of birth control for the next 48 hours). If more than 1 dose is missed, use an additional birth control method for the rest of your pill pack until menses occurs. Contact your health care professional if more than 1 dose has been missed.  What may interact with this medicine?  Do not take this medicine with any of the following medications:  -amprenavir or fosamprenavir  -bosentan  This medicine may also interact with the following medications:  -antibiotics or medicines for infections, especially rifampin, rifabutin, rifapentine, and griseofulvin, and possibly penicillins or tetracyclines  -aprepitant  -barbiturate medicines, such as phenobarbital  -carbamazepine  -felbamate  -modafinil  -oxcarbazepine  -phenytoin  -ritonavir or other medicines for HIV infection or AIDS  -St. John's wort  -topiramate  This list may not describe all possible interactions. Give your health care provider a list of all the medicines, herbs, non-prescription drugs, or dietary supplements you use. Also tell them if you smoke, drink alcohol, or use illegal drugs. Some items may interact with your medicine.  What should I watch for while using this medicine?  Visit your doctor or health care professional for regular checks on your progress. You will need a regular breast and pelvic exam and Pap smear while on this medicine.  Use an additional   method of birth control during the first cycle that you take these tablets.  If you have any reason to think you are pregnant, stop taking this medicine right away and contact your doctor or health care professional.  If you are taking this medicine for hormone related problems, it may take several cycles of use to see improvement in your condition.  This medicine does not protect you against HIV infection (AIDS)  or any other sexually transmitted diseases.  What side effects may I notice from receiving this medicine?  Side effects that you should report to your doctor or health care professional as soon as possible:  -breast tenderness or discharge  -pain in the abdomen, chest, groin or leg  -severe headache  -skin rash, itching, or hives  -sudden shortness of breath  -unusually weak or tired  -vision or speech problems  -yellowing of skin or eyes  Side effects that usually do not require medical attention (report to your doctor or health care professional if they continue or are bothersome):  -changes in sexual desire  -change in menstrual flow  -facial hair growth  -fluid retention and swelling  -headache  -irritability  -nausea  -weight gain or loss  This list may not describe all possible side effects. Call your doctor for medical advice about side effects. You may report side effects to FDA at 1-800-FDA-1088.  Where should I keep my medicine?  Keep out of the reach of children.  Store at room temperature between 15 and 30 degrees C (59 and 86 degrees F). Throw away any unused medicine after the expiration date.  NOTE: This sheet is a summary. It may not cover all possible information. If you have questions about this medicine, talk to your doctor, pharmacist, or health care provider.  © 2019 [employer] (2012-05-16 16:41:35)

## 2019-02-12 ENCOUNTER — Ambulatory Visit: Payer: Medicaid Other | Attending: Student | Admitting: Physical Therapy

## 2019-03-29 ENCOUNTER — Other Ambulatory Visit: Payer: Self-pay | Admitting: Student

## 2020-03-05 IMAGING — MR MRI HEAD WITHOUT AND WITH CONTRAST
10 of 13 series · 33 of 48 positions shown · IV contrast (gadavist)
Comparison: Head CT 08/31/2014 and MRI 05/13/2008

CLINICAL DATA: Postpartum a clamp CO.  Seizures and headache.

EXAM:
MRI HEAD WITHOUT AND WITH CONTRAST
TECHNIQUE: Multiplanar, multiecho pulse sequences of the brain and surrounding
structures were obtained without and with intravenous contrast.
CONTRAST:  7.9 mL Gadavist

[Series 3: DWI · axial · 3.0mm · 1.09mm/px · z∈[-107,+41]mm · 8 of 102 slices shown (1 of 4)]
[im 1/102]
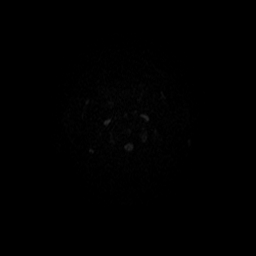
[im 15/102]
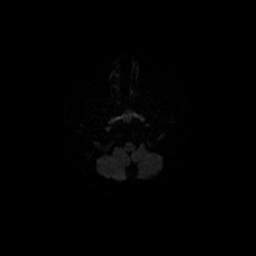
[im 29/102]
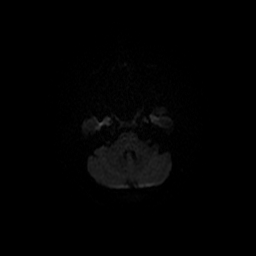
[im 44/102]
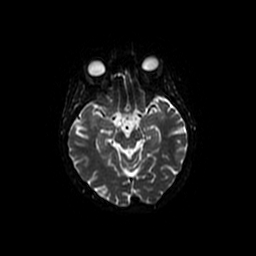
[im 58/102]
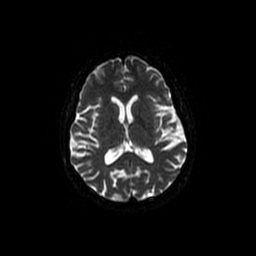
[im 73/102]
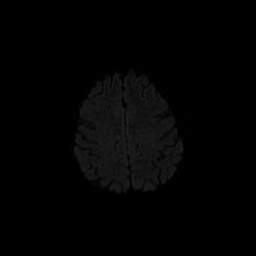
[im 87/102]
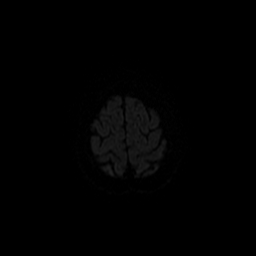
[im 102/102]
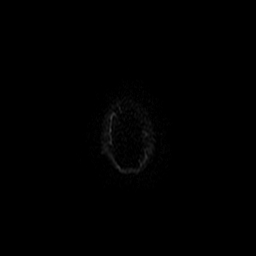

[Series 4: DWI · coronal · 5.0mm · 1.09mm/px · 5 of 70 slices shown (2 of 4)]
[im 1/70]
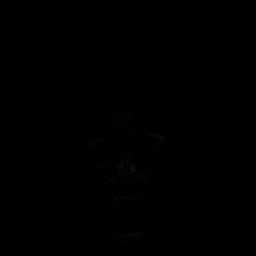
[im 18/70]
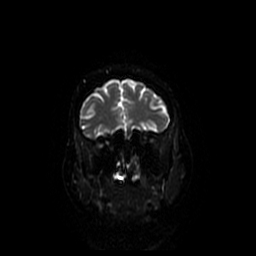
[im 35/70]
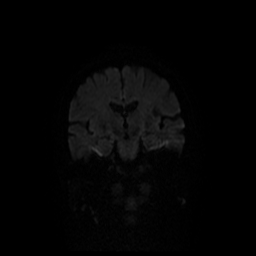
[im 52/70]
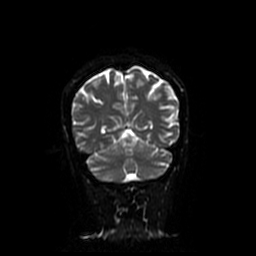
[im 70/70]
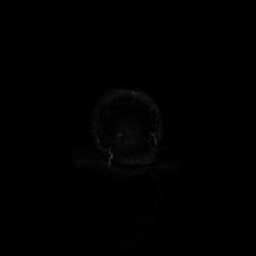

[Series 5: T1 · sagittal · 5.0mm · 0.47mm/px · 2 of 23 slices shown]
[im 1/23]
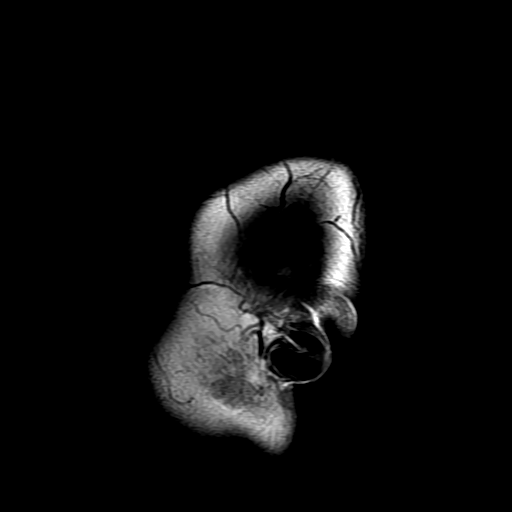
[im 23/23]
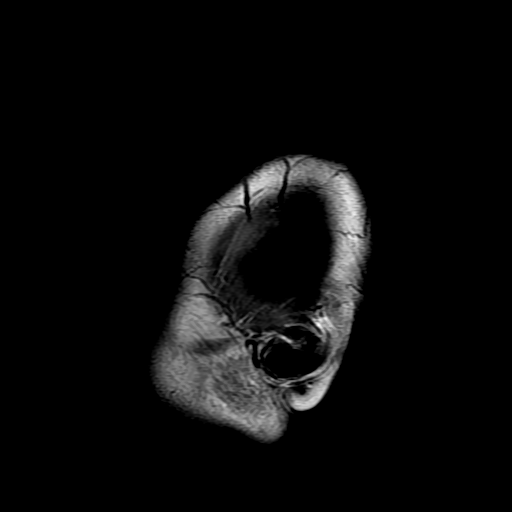

[Series 6: T2 · axial · 5.0mm · 0.43mm/px · z∈[-92,+51]mm · 2 of 25 slices shown (1 of 2)]
[im 1/25]
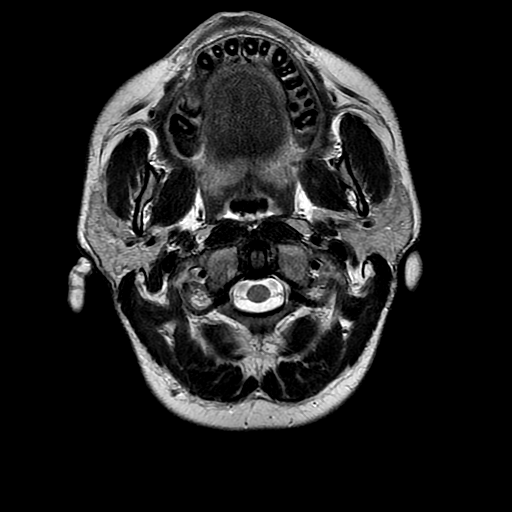
[im 25/25]
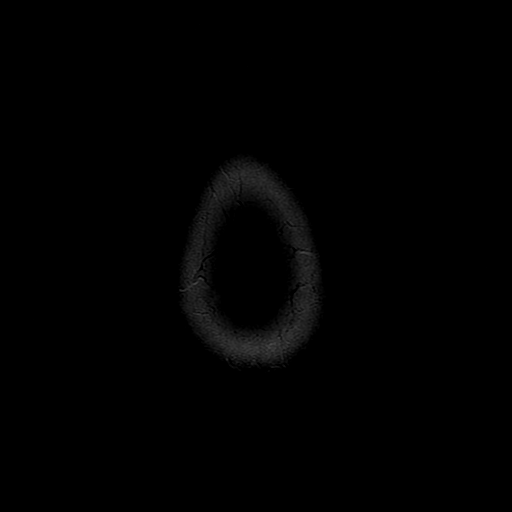

[Series 7: FLAIR · axial · 5.0mm · 0.43mm/px · z∈[-92,+51]mm · 2 of 25 slices shown (1 of 2)]
[im 1/25]
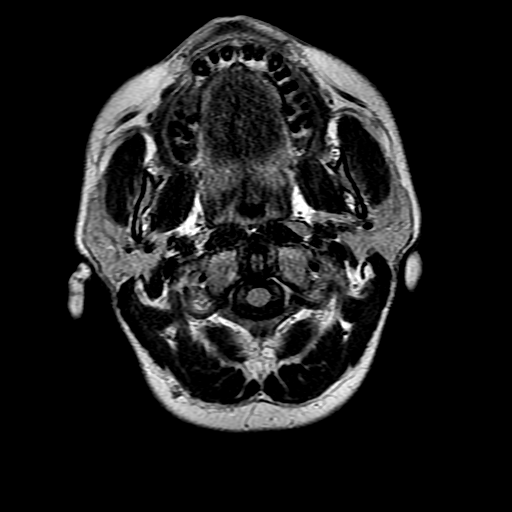
[im 25/25]
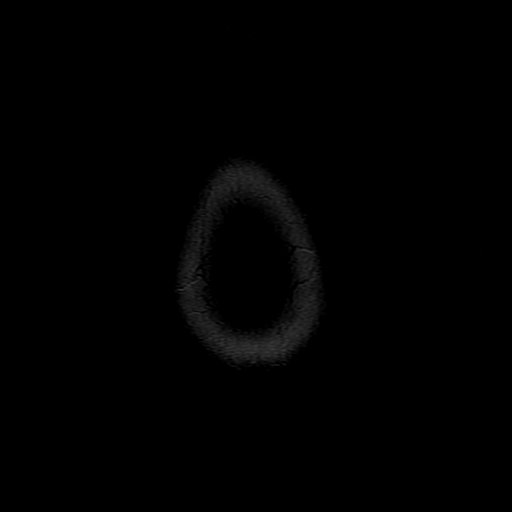

[Series 10: T2 · coronal · 3.0mm · 0.35mm/px · 2 of 32 slices shown (2 of 2)]
[im 1/32]
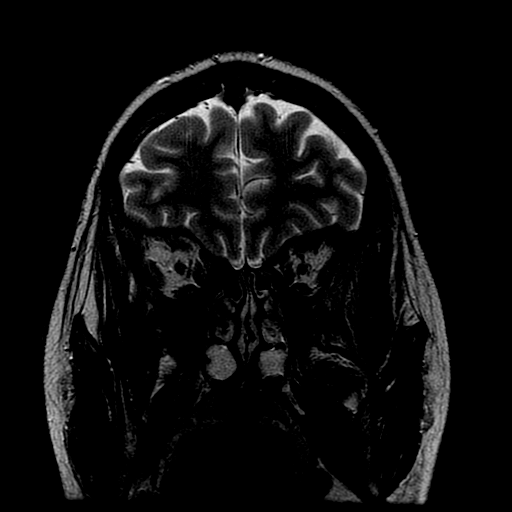
[im 32/32]
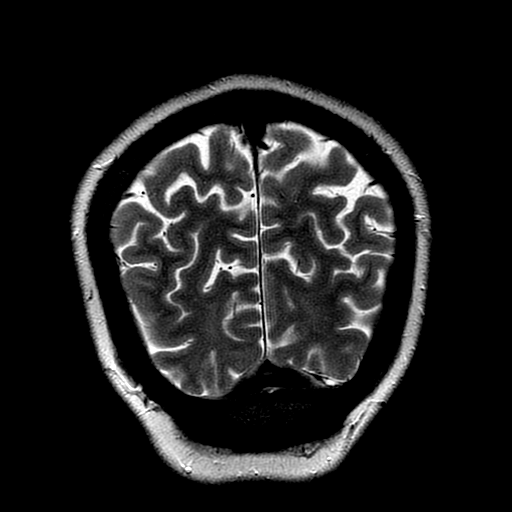

[Series 11: FLAIR · coronal · 3.0mm · 0.39mm/px · 3 of 37 slices shown (2 of 2)]
[im 1/37]
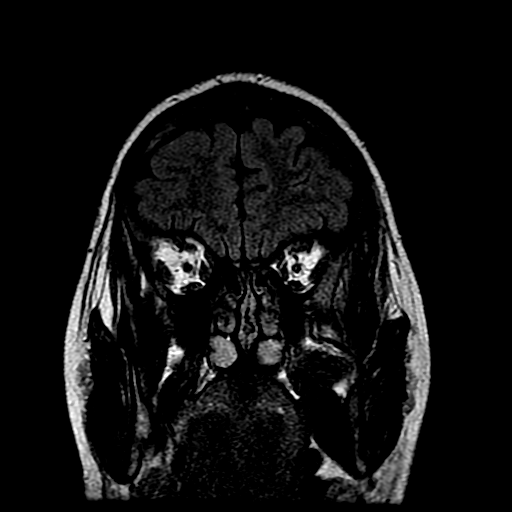
[im 19/37]
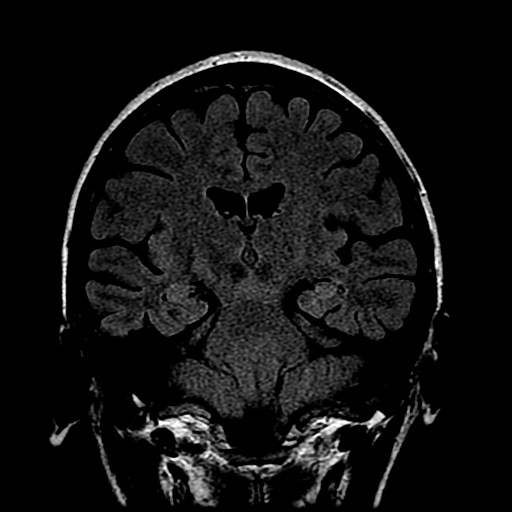
[im 37/37]
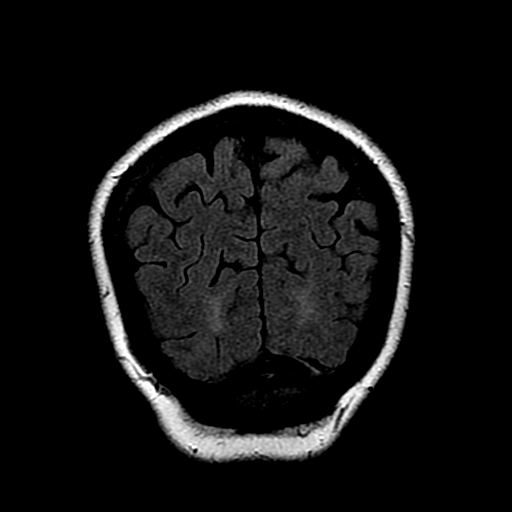

[Series 12: T2 post-contrast · coronal · 5.0mm · 0.39mm/px · 2 of 26 slices shown]
[im 1/26]
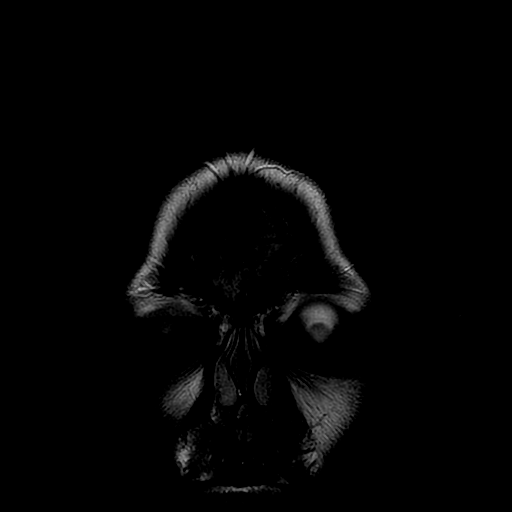
[im 26/26]
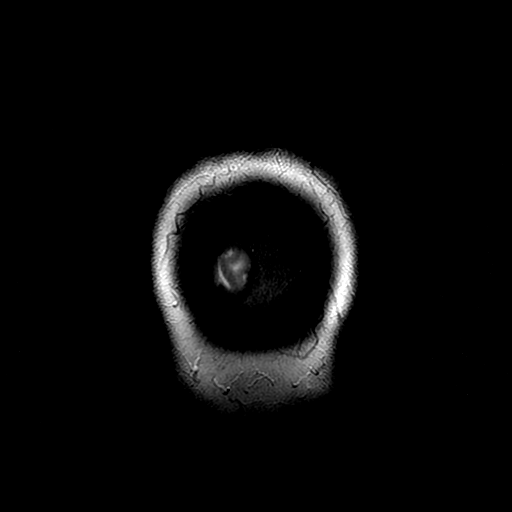

[Series 300: DWI · axial · 3.0mm · 1.09mm/px · z∈[-107,+41]mm · 4 of 51 slices shown (3 of 4)]
[im 1/51]
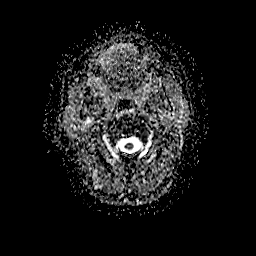
[im 17/51]
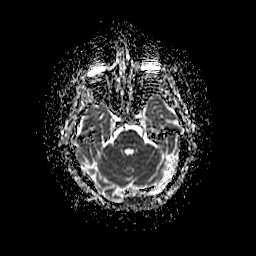
[im 34/51]
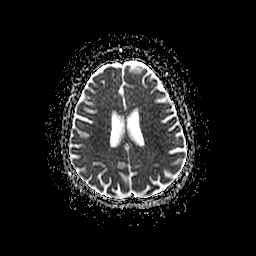
[im 51/51]
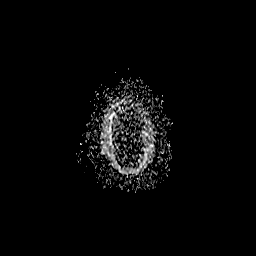

[Series 400: DWI · coronal · 5.0mm · 1.09mm/px · 3 of 35 slices shown (4 of 4)]
[im 1/35]
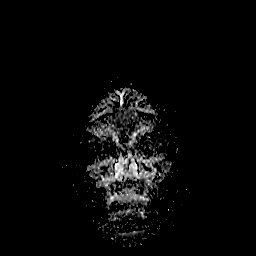
[im 18/35]
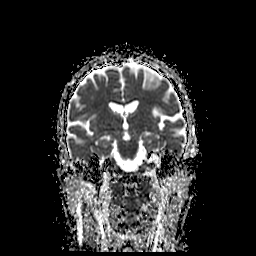
[im 35/35]
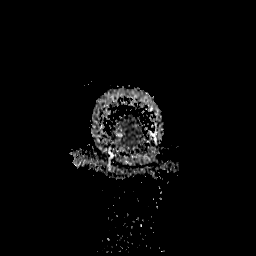

[33 of 48 positions shown; findings below may reference images not displayed]

FINDINGS: Brain: There is no evidence of acute infarct, intracranial
hemorrhage, mass, midline shift, or extra-axial fluid collection.
The ventricles and sulci are normal. The brain is normal in signal.
The mesial temporal lobe structures are symmetric and unremarkable.
No abnormal enhancement is identified.

Vascular: Major intracranial vascular flow voids are preserved.
Normal enhancement of the major dural venous sinuses.

Skull and upper cervical spine: Unremarkable bone marrow signal.

Sinuses/Orbits: Unremarkable orbits. Minimal scattered mucosal
thickening in the paranasal sinuses. Trace right mastoid effusion.

Other: None.
IMPRESSION: Unremarkable appearance of the brain.

## 2020-08-16 ENCOUNTER — Encounter: Payer: Self-pay | Admitting: *Deleted

## 2020-08-29 ENCOUNTER — Ambulatory Visit: Payer: Medicaid Other | Admitting: Obstetrics & Gynecology

## 2021-06-02 ENCOUNTER — Ambulatory Visit: Payer: Medicaid Other | Admitting: Family Medicine

## 2022-06-27 ENCOUNTER — Ambulatory Visit: Payer: Medicaid Other | Admitting: Nurse Practitioner

## 2022-07-11 ENCOUNTER — Other Ambulatory Visit: Payer: Self-pay | Admitting: Nurse Practitioner

## 2022-07-11 ENCOUNTER — Encounter: Payer: Self-pay | Admitting: Nurse Practitioner

## 2022-07-11 ENCOUNTER — Ambulatory Visit (INDEPENDENT_AMBULATORY_CARE_PROVIDER_SITE_OTHER): Payer: Medicaid Other | Admitting: Nurse Practitioner

## 2022-07-11 VITALS — BP 136/86 | HR 87 | Temp 97.3°F | Ht 64.0 in | Wt 238.0 lb

## 2022-07-11 DIAGNOSIS — Z862 Personal history of diseases of the blood and blood-forming organs and certain disorders involving the immune mechanism: Secondary | ICD-10-CM

## 2022-07-11 DIAGNOSIS — E66813 Obesity, class 3: Secondary | ICD-10-CM

## 2022-07-11 DIAGNOSIS — K732 Chronic active hepatitis, not elsewhere classified: Secondary | ICD-10-CM

## 2022-07-11 DIAGNOSIS — Z86711 Personal history of pulmonary embolism: Secondary | ICD-10-CM

## 2022-07-11 DIAGNOSIS — Z9884 Bariatric surgery status: Secondary | ICD-10-CM

## 2022-07-11 DIAGNOSIS — F411 Generalized anxiety disorder: Secondary | ICD-10-CM

## 2022-07-11 DIAGNOSIS — Z8659 Personal history of other mental and behavioral disorders: Secondary | ICD-10-CM

## 2022-07-11 DIAGNOSIS — K219 Gastro-esophageal reflux disease without esophagitis: Secondary | ICD-10-CM

## 2022-07-11 DIAGNOSIS — J4520 Mild intermittent asthma, uncomplicated: Secondary | ICD-10-CM | POA: Diagnosis not present

## 2022-07-11 DIAGNOSIS — Z23 Encounter for immunization: Secondary | ICD-10-CM

## 2022-07-11 DIAGNOSIS — Z6841 Body Mass Index (BMI) 40.0 and over, adult: Secondary | ICD-10-CM

## 2022-07-11 DIAGNOSIS — N809 Endometriosis, unspecified: Secondary | ICD-10-CM

## 2022-07-11 DIAGNOSIS — F17219 Nicotine dependence, cigarettes, with unspecified nicotine-induced disorders: Secondary | ICD-10-CM

## 2022-07-11 DIAGNOSIS — F902 Attention-deficit hyperactivity disorder, combined type: Secondary | ICD-10-CM

## 2022-07-11 MED ORDER — ALBUTEROL SULFATE HFA 108 (90 BASE) MCG/ACT IN AERS: 2.0000 | INHALATION_SPRAY | Freq: Four times a day (QID) | RESPIRATORY_TRACT | 2 refills | Status: DC | PRN

## 2022-07-11 MED ORDER — GABAPENTIN 300 MG PO CAPS: 600.0000 mg | ORAL_CAPSULE | Freq: Three times a day (TID) | ORAL | 1 refills | Status: DC

## 2022-07-11 MED ORDER — PANTOPRAZOLE SODIUM 40 MG PO TBEC: 40.0000 mg | DELAYED_RELEASE_TABLET | Freq: Every day | ORAL | 3 refills | Status: DC

## 2022-07-11 NOTE — Patient Instructions (Addendum)
Continue medications We will call you with lab results Begin Protonix 40 mg daily for GERD Avoid foods that trigger GERD Consider smoking cessation Follow-up in 81-month   Anemia  Anemia is a condition in which there are not enough red blood cells or hemoglobin in the blood. Hemoglobin is a substance in red blood cells that carries oxygen. When you do not have enough red blood cells or hemoglobin (are anemic), your body cannot get enough oxygen, and your organs may not work properly. As a result, you may feel very tired or have other problems. What are the causes? Common causes of anemia include: Excessive bleeding. Anemia can be caused by excessive bleeding inside or outside the body, including bleeding from the intestines or from heavy menstrual periods in females. Poor nutrition. Long-lasting (chronic) kidney, thyroid, and liver disease. Bone marrow disorders, spleen problems, and blood disorders. Cancer and treatments for cancer. Human immunodeficiency virus (HIV) and acquired immunodeficiency syndrome (AIDS). Infections, medicines, and autoimmune disorders that destroy red blood cells. What are the signs or symptoms? Symptoms of this condition include: Minor weakness. Dizziness. Headache, or difficulties concentrating and sleeping. Heartbeats that feel irregular or faster than normal (palpitations). Shortness of breath, especially with exercise. Pale skin, lips, and nails, or cold hands and feet. Upset stomach (indigestion) and nausea. Symptoms may occur suddenly or develop slowly. If your anemia is mild, you may not have symptoms. How is this diagnosed? This condition is diagnosed based on blood tests, your medical history, and a physical exam. In some cases, a test may be needed in which cells are removed from the soft tissue inside of a bone and looked at under a microscope (bone marrow biopsy). Your health care provider may also check your stool (feces) for blood and may do  more testing to look for the cause of your bleeding. Other tests may include: Imaging tests, such as a CT scan or MRI. A procedure to see inside your esophagus and stomach (endoscopy). The esophagus is the part of the body that moves food from your mouth to your stomach. A procedure to see inside your colon and rectum (colonoscopy). How is this treated? Treatment for this condition depends on the cause. If you continue to lose a lot of blood, you may need to be treated at a hospital. Treatment may include: Taking supplements of iron, vitamin BM22 or folic acid. Taking a hormone medicine (erythropoietin) that can help to stimulate red blood cell growth. Receiving donated blood through an IV (blood transfusion). This may be needed if you lose a lot of blood. Making changes to your diet. Having surgery to remove your spleen. Follow these instructions at home: Take over-the-counter and prescription medicines only as told by your health care provider. Take supplements only as told by your health care provider. Follow any diet instructions that you were given by your health care provider. Keep all follow-up visits. Your health care provider will want to recheck your blood tests. Contact a health care provider if: You develop new bleeding anywhere in the body. You are very weak. Get help right away if: You are short of breath. You have pain in your abdomen or chest. You are dizzy or feel faint. You have trouble concentrating. You have bloody stools, black stools, or tarry stools. You vomit repeatedly or you vomit up blood. These symptoms may be an emergency. Get help right away. Call 911. Do not wait to see if the symptoms will go away. Do not drive yourself to the  hospital. Summary Anemia is a condition in which you do not have enough red blood cells or enough of a substance in your red blood cells that carries oxygen. Symptoms may occur suddenly or develop slowly. If your anemia is mild,  you may not have symptoms. This condition is diagnosed with blood tests, a medical history, and a physical exam. Other tests may be needed. Treatment for this condition depends on the cause of the anemia. This information is not intended to replace advice given to you by your health care provider. Make sure you discuss any questions you have with your health care provider. Document Revised: 11/20/2021 Document Reviewed: 11/20/2021 Elsevier Patient Education  [place] of Quitting Smoking Quitting smoking is a physical and mental challenge. You may have cravings, withdrawal symptoms, and temptation to smoke. Before quitting, work with your health care provider to make a plan that can help you manage quitting. Making a plan before you quit may keep you from smoking when you have the urge to smoke while trying to quit. How to manage lifestyle changes Managing stress Stress can make you want to smoke, and wanting to smoke may cause stress. It is important to find ways to manage your stress. You could try some of the following: Practice relaxation techniques. Breathe slowly and deeply, in through your nose and out through your mouth. Listen to music. Soak in a bath or take a shower. Imagine a peaceful place or vacation. Get some support. Talk with family or friends about your stress. Join a support group. Talk with a counselor or therapist. Get some physical activity. Go for a walk, run, or bike ride. Play a favorite sport. Practice yoga.  Medicines Talk with your health care provider about medicines that might help you deal with cravings and make quitting easier for you. Relationships Social situations can be difficult when you are quitting smoking. To manage this, you can: Avoid parties and other social situations where people might be smoking. Avoid alcohol. Leave right away if you have the urge to smoke. Explain to your family and friends that you  are quitting smoking. Ask for support and let them know you might be a bit grumpy. Plan activities where smoking is not an option. General instructions Be aware that many people gain weight after they quit smoking. However, not everyone does. To keep from gaining weight, have a plan in place before you quit, and stick to the plan after you quit. Your plan should include: Eating healthy snacks. When you have a craving, it may help to: Eat popcorn, or try carrots, celery, or other cut vegetables. Chew sugar-free gum. Changing how you eat. Eat small portion sizes at meals. Eat 4-6 small meals throughout the day instead of 1-2 large meals a day. Be mindful when you eat. You should avoid watching television or doing other things that might distract you as you eat. Exercising regularly. Make time to exercise each day. If you do not have time for a long workout, do short bouts of exercise for 5-10 minutes several times a day. Do some form of strengthening exercise, such as weight lifting. Do some exercise that gets your heart beating and causes you to breathe deeply, such as walking fast, running, swimming, or biking. This is very important. Drinking plenty of water or other low-calorie or no-calorie drinks. Drink enough fluid to keep your urine pale yellow.  How to recognize withdrawal symptoms Your body and mind may experience discomfort  as you try to get used to not having nicotine in your system. These effects are called withdrawal symptoms. They may include: Feeling hungrier than normal. Having trouble concentrating. Feeling irritable or restless. Having trouble sleeping. Feeling depressed. Craving a cigarette. These symptoms may surprise you, but they are normal to have when quitting smoking. To manage withdrawal symptoms: Avoid places, people, and activities that trigger your cravings. Remember why you want to quit. Get plenty of sleep. Avoid coffee and other drinks that contain  caffeine. These may worsen some of your symptoms. How to manage cravings Come up with a plan for how to deal with your cravings. The plan should include the following: A definition of the specific situation you want to deal with. An activity or action you will take to replace smoking. A clear idea for how this action will help. The name of someone who could help you with this. Cravings usually last for 5-10 minutes. Consider taking the following actions to help you with your plan to deal with cravings: Keep your mouth busy. Chew sugar-free gum. Suck on hard candies or a straw. Brush your teeth. Keep your hands and body busy. Change to a different activity right away. Squeeze or play with a ball. Do an activity or a hobby, such as making bead jewelry, practicing needlepoint, or working with wood. Mix up your normal routine. Take a short exercise break. Go for a quick walk, or run up and down stairs. Focus on doing something kind or helpful for someone else. Call a friend or family member to talk during a craving. Join a support group. Contact a quitline. Where to find support To get help or find a support group: Call the [facility]'s Smoking Quitline: 1-800-QUIT-NOW 7706225006) Text QUIT to SmokefreeTXT: 287681 Where to find more information Visit these websites to find more information on quitting smoking: U.S. Department of Health and Human Services: www.smokefree.gov American Lung Association: www.freedomfromsmoking.org Centers for Disease Control and Prevention (CDC): http://www.wolf.info/ American Heart Association: www.heart.org Contact a health care provider if: You want to change your plan for quitting. The medicines you are taking are not helping. Your eating feels out of control or you cannot sleep. You feel depressed or become very anxious. Summary Quitting smoking is a physical and mental challenge. You will face cravings, withdrawal symptoms, and temptation to  smoke again. Preparation can help you as you go through these challenges. Try different techniques to manage stress, handle social situations, and prevent weight gain. You can deal with cravings by keeping your mouth busy (such as by chewing gum), keeping your hands and body busy, calling family or friends, or contacting a quitline for people who want to quit smoking. You can deal with withdrawal symptoms by avoiding places where people smoke, getting plenty of rest, and avoiding drinks that contain caffeine. This information is not intended to replace advice given to you by your health care provider. Make sure you discuss any questions you have with your health care provider. Document Revised: 08/18/2021 Document Reviewed: 08/18/2021 Elsevier Patient Education  [place].     Hepatitis C  Hepatitis C is a liver infection that is caused by a virus. Many people have no symptoms or only mild symptoms. Hepatitis C is contagious. This means that it can spread from person to person. Over time, the infection can lead to serious liver problems. What are the causes? This condition is caused by a virus. It can spread through: Contact with body fluids from someone  who has the virus. This includes: Blood. Semen or vaginal fluids. Childbirth. A woman can pass the virus to her baby during birth. Blood or organ donations that were done in the [place] before 1992. What increases the risk? Having contact with needles or syringes that have the virus on them. This may happen when you: Get acupuncture. This is a treatment that puts thin needles through your skin. Get a tattoo or body piercing. Inject drugs. Having sex with someone who has the virus. The virus can be passed through vaginal, oral, or anal sex. Getting treatment to clean your blood (kidney dialysis). Having HIV or AIDS. Having a job that puts you in contact with blood or body fluids. What are the signs or symptoms? You feel  very tired. You are not hungry. You feel like you may vomit or you vomit. You have pain in your belly or fluid builds up in your belly. Your pee is dark yellow. Your skin or the white parts of your eyes look yellow (jaundice). Your skin is itchy. Your poop is light in color. You have joint pain. You bleed or bruise often. Often, there are no symptoms. How is this treated? Treatment may depend on your condition and whether you have liver damage. Treatment may include: Taking antiviral medicines and other medicines. Having follow-up treatments every 6-12 months for liver problems. Getting a new liver from a donor. This is called a liver transplant. Follow these instructions at home: Medicines Take over-the-counter and prescription medicines only as told by your doctor. If you were given an antiviral medicine, take it as told by your doctor. Do not stop using the antiviral even if you start to feel better. Do not take any new medicines unless your doctor says that this is okay. This includes over-the-counter medicines and supplements. Activity Rest as needed. Do not have sex until your doctor says this is okay. Do not swim or use hot tubs if you have open sores or wounds. Return to your normal activities as told by your doctor. Ask your doctor what activities are safe for you. Ask your doctor when you may go back to school or work. Eating and drinking  Eat a balanced diet. Eat plenty of: Fruits and vegetables. Whole grains. Lean meats or non-meat proteins, such as beans or tofu. Drink enough fluid to keep your pee pale yellow. Do not drink alcohol. General instructions Do not share toothbrushes, nail clippers, or razors. Wash your hands often with soap and water for at least 20 seconds. If you do not have soap and water, use hand sanitizer. Cover any cuts or open sores on your skin. Keep all follow-up visits. You may need follow-up visits every 6-12 months. How is this  prevented? Wash your hands often with soap and water for at least 20 seconds. Do not share needles or syringes. Use a condom every time you have vaginal, oral, or anal sex. Latex condoms should be used, if possible. Do not handle blood or body fluids without gloves or other protection. Avoid getting tattoos or piercings in shops that are not clean. Where to find more information Centers for Disease Control and Prevention: http://www.wolf.info/ World Health Organization: RoleLink.com.br Contact a doctor if: You have a fever or chills. You have belly pain. Your pee is dark. Your poop is a light color or tan. You have joint pain. Get help right away if: You feel more tired. You do not feel like eating. You cannot eat or drink without vomiting. Your  skin or the whites of your eyes turn yellow or turn more yellow than they were before. You bruise or bleed easily. Summary Hepatitis C is a liver infection that is caused by a virus. Over time, this can lead to serious liver problems. The virus is contagious. This means that it can spread from person to person through blood, semen, or vaginal fluids. Do not take any new medicines unless your doctor says that this is okay. This information is not intended to replace advice given to you by your health care provider. Make sure you discuss any questions you have with your health care provider. Document Revised: 07/14/2020 Document Reviewed: 07/14/2020 Elsevier Patient Education  [place] for Gastroesophageal Reflux Disease, Adult When you have gastroesophageal reflux disease (GERD), the foods you eat and your eating habits are very important. Choosing the right foods can help ease your discomfort. Think about working with a food expert (dietitian) to help you make good choices. What are tips for following this plan? Reading food labels Look for foods that are low in saturated fat. Foods that may help with your symptoms  include: Foods that have less than 5% of daily value (DV) of fat. Foods that have 0 grams of trans fat. Cooking Do not fry your food. Cook your food by baking, steaming, grilling, or broiling. These are all methods that do not need a lot of fat for cooking. To add flavor, try to use herbs that are low in spice and acidity. Meal planning  Choose healthy foods that are low in fat, such as: Fruits and vegetables. Whole grains. Low-fat dairy products. Lean meats, fish, and poultry. Eat small meals often instead of eating 3 large meals each day. Eat your meals slowly in a place where you are relaxed. Avoid bending over or lying down until 2-3 hours after eating. Limit high-fat foods such as fatty meats or fried foods. Limit your intake of fatty foods, such as oils, butter, and shortening. Avoid the following as told by your doctor: Foods that cause symptoms. These may be different for different people. Keep a food diary to keep track of foods that cause symptoms. Alcohol. Drinking a lot of liquid with meals. Eating meals during the 2-3 hours before bed. Lifestyle Stay at a healthy weight. Ask your doctor what weight is healthy for you. If you need to lose weight, work with your doctor to do so safely. Exercise for at least 30 minutes on 5 or more days each week, or as told by your doctor. Wear loose-fitting clothes. Do not smoke or use any products that contain nicotine or tobacco. If you need help quitting, ask your doctor. Sleep with the head of your bed higher than your feet. Use a wedge under the mattress or blocks under the bed frame to raise the head of the bed. Chew sugar-free gum after meals. What foods should eat?  Eat a healthy, well-balanced diet of fruits, vegetables, whole grains, low-fat dairy products, lean meats, fish, and poultry. Each person is different. Foods that may cause symptoms in one person may not cause any symptoms in another person. Work with your doctor to  find foods that are safe for you. The items listed above may not be a complete list of what you can eat and drink. Contact a food expert for more options. What foods should I avoid? Limiting some of these foods may help in managing the symptoms of GERD. Everyone is different. Talk  with a food expert or your doctor to help you find the exact foods to avoid, if any. Fruits Any fruits prepared with added fat. Any fruits that cause symptoms. For some people, this may include citrus fruits, such as oranges, grapefruit, pineapple, and lemons. Vegetables Deep-fried vegetables. [place] fries. Any vegetables prepared with added fat. Any vegetables that cause symptoms. For some people, this may include tomatoes and tomato products, chili peppers, onions and garlic, and horseradish. Grains Pastries or quick breads with added fat. Meats and other proteins High-fat meats, such as fatty beef or pork, hot dogs, ribs, ham, sausage, salami, and bacon. Fried meat or protein, including fried fish and fried chicken. Nuts and nut butters, in large amounts. Dairy Whole milk and chocolate milk. Sour cream. Cream. Ice cream. Cream cheese. Milkshakes. Fats and oils Butter. Margarine. Shortening. Ghee. Beverages Coffee and tea, with or without caffeine. Carbonated beverages. Sodas. Energy drinks. Fruit juice made with acidic fruits, such as orange or grapefruit. Tomato juice. Alcoholic drinks. Sweets and desserts Chocolate and cocoa. Donuts. Seasonings and condiments Pepper. Peppermint and spearmint. Added salt. Any condiments, herbs, or seasonings that cause symptoms. For some people, this may include curry, hot sauce, or vinegar-based salad dressings. The items listed above may not be a complete list of what you should not eat and drink. Contact a food expert for more options. Questions to ask your doctor Diet and lifestyle changes are often the first steps that are taken to manage symptoms of GERD. If diet and  lifestyle changes do not help, talk with your doctor about taking medicines. Where to find more information International Foundation for Gastrointestinal Disorders: aboutgerd.org Summary When you have GERD, food and lifestyle choices are very important in easing your symptoms. Eat small meals often instead of 3 large meals a day. Eat your meals slowly and in a place where you are relaxed. Avoid bending over or lying down until 2-3 hours after eating. Limit high-fat foods such as fatty meats or fried foods. This information is not intended to replace advice given to you by your health care provider. Make sure you discuss any questions you have with your health care provider. Document Revised: 03/07/2020 Document Reviewed: 03/07/2020 Elsevier Patient Education  [place], Adult After being diagnosed with anxiety, you may be relieved to know why you have felt or behaved a certain way. You may also feel overwhelmed about the treatment ahead and what it will mean for your life. With care and support, you can manage this condition. How to manage lifestyle changes Managing stress and anxiety  Stress is your body's reaction to life changes and events, both good and bad. Most stress will last just a few hours, but stress can be ongoing and can lead to more than just stress. Although stress can play a major role in anxiety, it is not the same as anxiety. Stress is usually caused by something external, such as a deadline, test, or competition. Stress normally passes after the triggering event has ended.  Anxiety is caused by something internal, such as imagining a terrible outcome or worrying that something will go wrong that will devastate you. Anxiety often does not go away even after the triggering event is over, and it can become long-term (chronic) worry. It is important to understand the differences between stress and anxiety and to manage your stress effectively so that it  does not lead to an anxious response. Talk with your health care provider or a  counselor to learn more about reducing anxiety and stress. He or she may suggest tension reduction techniques, such as: Music therapy. Spend time creating or listening to music that you enjoy and that inspires you. Mindfulness-based meditation. Practice being aware of your normal breaths while not trying to control your breathing. It can be done while sitting or walking. Centering prayer. This involves focusing on a word, phrase, or sacred image that means something to you and brings you peace. Deep breathing. To do this, expand your stomach and inhale slowly through your nose. Hold your breath for 3-5 seconds. Then exhale slowly, letting your stomach muscles relax. Self-talk. Learn to notice and identify thought patterns that lead to anxiety reactions and change those patterns to thoughts that feel peaceful. Muscle relaxation. Taking time to tense muscles and then relax them. Choose a tension reduction technique that fits your lifestyle and personality. These techniques take time and practice. Set aside 5-15 minutes a day to do them. Therapists can offer counseling and training in these techniques. The training to help with anxiety may be covered by some insurance plans. Other things you can do to manage stress and anxiety include: Keeping a stress diary. This can help you learn what triggers your reaction and then learn ways to manage your response. Thinking about how you react to certain situations. You may not be able to control everything, but you can control your response. Making time for activities that help you relax and not feeling guilty about spending your time in this way. Doing visual imagery. This involves imagining or creating mental pictures to help you relax. Practicing yoga. Through yoga poses, you can lower tension and promote relaxation.  Medicines Medicines can help ease symptoms. Medicines for  anxiety include: Antidepressant medicines. These are usually prescribed for long-term daily control. Anti-anxiety medicines. These may be added in severe cases, especially when panic attacks occur. Medicines will be prescribed by a health care provider. When used together, medicines, psychotherapy, and tension reduction techniques may be the most effective treatment. Relationships Relationships can play a big part in helping you recover. Try to spend more time connecting with trusted friends and family members. Consider going to couples counseling if you have a partner, taking family education classes, or going to family therapy. Therapy can help you and others better understand your condition. How to recognize changes in your anxiety Everyone responds differently to treatment for anxiety. Recovery from anxiety happens when symptoms decrease and stop interfering with your daily activities at home or work. This may mean that you will start to: Have better concentration and focus. Worry will interfere less in your daily thinking. Sleep better. Be less irritable. Have more energy. Have improved memory. It is also important to recognize when your condition is getting worse. Contact your health care provider if your symptoms interfere with home or work and you feel like your condition is not improving. Follow these instructions at home: Activity Exercise. Adults should do the following: Exercise for at least 150 minutes each week. The exercise should increase your heart rate and make you sweat (moderate-intensity exercise). Strengthening exercises at least twice a week. Get the right amount and quality of sleep. Most adults need 7-9 hours of sleep each night. Lifestyle  Eat a healthy diet that includes plenty of vegetables, fruits, whole grains, low-fat dairy products, and lean protein. Do not eat a lot of foods that are high in fats, added sugars, or salt (sodium). Make choices that simplify  your life. Do  not use any products that contain nicotine or tobacco. These products include cigarettes, chewing tobacco, and vaping devices, such as e-cigarettes. If you need help quitting, ask your health care provider. Avoid caffeine, alcohol, and certain over-the-counter cold medicines. These may make you feel worse. Ask your pharmacist which medicines to avoid. General instructions Take over-the-counter and prescription medicines only as told by your health care provider. Keep all follow-up visits. This is important. Where to find support You can get help and support from these sources: Self-help groups. Online and [employer]. A trusted spiritual leader. Couples counseling. Family education classes. Family therapy. Where to find more information You may find that joining a support group helps you deal with your anxiety. The following sources can help you locate counselors or support groups near you: [place]: www.mentalhealthamerica.net Anxiety and Depression Association of [place] (ADAA): https://www.clark.net/ National Alliance on Mental Illness (NAMI): www.nami.org Contact a health care provider if: You have a hard time staying focused or finishing daily tasks. You spend many hours a day feeling worried about everyday life. You become exhausted by worry. You start to have headaches or frequently feel tense. You develop chronic nausea or diarrhea. Get help right away if: You have a racing heart and shortness of breath. You have thoughts of hurting yourself or others. If you ever feel like you may hurt yourself or others, or have thoughts about taking your own life, get help right away. Go to your nearest emergency department or: Call your local emergency services (911 in the [place] Call a suicide crisis helpline, such as the [facility] at 218-880-1866 or 988 in the [place]. This is open 24 hours a day in the [place] Text the Crisis Text Line  at (403) 345-4758 (in the [place].). Summary Taking steps to learn and use tension reduction techniques can help calm you and help prevent triggering an anxiety reaction. When used together, medicines, psychotherapy, and tension reduction techniques may be the most effective treatment. Family, friends, and partners can play a big part in supporting you. This information is not intended to replace advice given to you by your health care provider. Make sure you discuss any questions you have with your health care provider. Document Revised: 03/22/2021 Document Reviewed: 12/18/2020 Elsevier Patient Education  [place].

## 2022-07-11 NOTE — Progress Notes (Signed)
New Patient Office Visit  Subjective    Patient ID: Ana Reyes, female    DOB: 1986-06-16  Age: 36 y.o. MRN: [id number]  CC:  Chief Complaint  Patient presents with   Establish Care    HPI GOLDEN EMILE presents to establish care has history HTN; she discontinued Vasotec that was prescribed by previous PCP. History of allergy induced asthma. States symptoms are intermittent, well-controlled with Albuterol inhaler. Current cigarette smoker. History of PE. States she is not currently using contraception. Reports recent miscarriage and anemia.States she has chronic pain related endometriosis. She has requested referral to ob/gyn for management. States she has GERD, treated with OTC Omeprazole. Requested prescription medication.See's Dr. Modena Nunnery in [facility] for anxiety taking xanax 1 mg BID and ADHD taking adderall 20 mg daily, states she takes Prozac prn for depression. She tells me she has hep C, has not received treatment.States she would like referral to liver specialist.   Outpatient Encounter Medications as of 07/11/2022  Medication Sig   amphetamine-dextroamphetamine (ADDERALL) 20 MG tablet Take by mouth.   FLUoxetine (PROZAC) 20 MG capsule Take by mouth as needed.   albuterol (PROVENTIL HFA;VENTOLIN HFA) 108 (90 BASE) MCG/ACT inhaler Inhale 2 puffs into the lungs every 6 (six) hours as needed (SOB/Wheezing).   albuterol (PROVENTIL) (5 MG/ML) 0.5% nebulizer solution Take 2.5 mg by nebulization every 4 (four) hours as needed for wheezing or shortness of breath.   ALPRAZolam (XANAX) 1 MG tablet Take 1 mg by mouth 2 (two) times daily as needed for anxiety.   calcium carbonate (TUMS - DOSED IN MG ELEMENTAL CALCIUM) 500 MG chewable tablet Chew 2 tablets by mouth 3 (three) times daily as needed for indigestion or heartburn.   cyclobenzaprine (FLEXERIL) 5 MG tablet Take 5 mg by mouth 3 (three) times daily as needed for muscle spasms.   enalapril (VASOTEC) 5 MG  tablet Take 1 tablet (5 mg total) by mouth daily. (Patient not taking: Reported on 07/11/2022)   gabapentin (NEURONTIN) 300 MG capsule Take 2 capsules (600 mg total) by mouth 3 (three) times daily.   [DISCONTINUED] acetaminophen (TYLENOL) 500 MG tablet Take 1,000 mg by mouth every 6 (six) hours as needed for mild pain or headache.   [DISCONTINUED] enoxaparin (LOVENOX) 40 MG/0.4ML injection Inject 40 mg into the skin daily.    [DISCONTINUED] Fluticasone-Salmeterol (ADVAIR) 500-50 MCG/DOSE AEPB Inhale 2 puffs into the lungs daily.   [DISCONTINUED] nicotine (NICODERM CQ - DOSED IN MG/24 HOURS) 14 mg/24hr patch Place 1 patch (14 mg total) onto the skin daily.   [DISCONTINUED] norethindrone (MICRONOR) 0.35 MG tablet Take 1 tablet (0.35 mg total) by mouth daily.   [DISCONTINUED] omeprazole (PRILOSEC) 20 MG capsule Take by mouth.   [DISCONTINUED] oxyCODONE-acetaminophen (PERCOCET/ROXICET) 5-325 MG tablet Take 1 tablet by mouth 3 (three) times daily.   No facility-administered encounter medications on file as of 07/11/2022.    Past Medical History:  Diagnosis Date   Asthma    Cervical dysplasia    Endometriosis    Hx of hepatitis C    Hx pulmonary embolism    Kidney stones     Past Surgical History:  Procedure Laterality Date   CHOLECYSTECTOMY     GASTRIC BYPASS     LAPAROSCOPY      Family History  Problem Relation Age of Onset   Anxiety disorder Mother    Anxiety disorder Father    ADD / ADHD Father    Bipolar disorder Sister  ADD / ADHD Brother    Anxiety disorder Brother    Heart attack Paternal Grandmother     Social History   Socioeconomic History   Marital status: Single    Spouse name: Not on file   Number of children: 2   Years of education: Not on file   Highest education level: Not on file  Occupational History   Not on file  Tobacco Use   Smoking status: Every Day    Packs/day: 1.00    Types: Cigarettes   Smokeless tobacco: Never  Substance and Sexual  Activity   Alcohol use: No   Drug use: No   Sexual activity: Yes    Partners: Male  Other Topics Concern   Not on file  Social History Narrative   Not on file   Social Determinants of Health   Financial Resource Strain: Low Risk  (07/11/2022)   Overall Financial Resource Strain (CARDIA)    Difficulty of Paying Living Expenses: Not hard at all  Food Insecurity: No Food Insecurity (07/11/2022)   Hunger Vital Sign    Worried About Running Out of Food in the Last Year: Never true    Ran Out of Food in the Last Year: Never true  Transportation Needs: No Transportation Needs (07/11/2022)   PRAPARE - Administrator, Civil Service (Medical): No    Lack of Transportation (Non-Medical): No  Physical Activity: Inactive (07/11/2022)   Exercise Vital Sign    Days of Exercise per Week: 0 days    Minutes of Exercise per Session: 0 min  Stress: No Stress Concern Present (07/11/2022)   [employer] of Occupational Health - Occupational Stress Questionnaire    Feeling of Stress : Not at all  Social Connections: Moderately Integrated (07/11/2022)   Social Connection and Isolation Panel [NHANES]    Frequency of Communication with Friends and Family: More than three times a week    Frequency of Social Gatherings with Friends and Family: More than three times a week    Attends Religious Services: More than 4 times per year    Active Member of [employer] or Organizations: No    Attends Banker Meetings: Never    Marital Status: Living with partner  Intimate Partner Violence: Not At Risk (07/11/2022)   Humiliation, Afraid, Rape, and Kick questionnaire    Fear of Current or Ex-Partner: No    Emotionally Abused: No    Physically Abused: No    Sexually Abused: No    Review of Systems  Constitutional:  Negative for chills, fever and malaise/fatigue.  HENT:  Negative for ear pain, sinus pain and sore throat.   Respiratory:  Positive for cough ("smokers cough"). Negative for  shortness of breath.   Cardiovascular:  Negative for chest pain.  Musculoskeletal:  Negative for myalgias.  Neurological:  Negative for headaches.  Endo/Heme/Allergies:  Bruises/bleeds easily.  Psychiatric/Behavioral:  The patient is nervous/anxious (controlled with medication).        Objective    BP 136/86   Pulse 87   Temp (!) 97.3 F (36.3 C)   Ht 5\' 4"  (1.626 m)   Wt 238 lb (108 kg)   LMP 05/21/2022 (Approximate)   SpO2 100%   BMI 40.85 kg/m   Physical Exam Vitals reviewed.  Constitutional:      Appearance: Normal appearance.  HENT:     Head: Normocephalic.     Right Ear: Tympanic membrane normal.     Left Ear: Tympanic membrane normal.  Nose: Nose normal.     Mouth/Throat:     Mouth: Mucous membranes are moist.  Eyes:     Pupils: Pupils are equal, round, and reactive to light.  Cardiovascular:     Rate and Rhythm: Normal rate and regular rhythm.  Pulmonary:     Effort: Pulmonary effort is normal.     Breath sounds: Normal breath sounds.  Abdominal:     General: Bowel sounds are normal.     Palpations: Abdomen is soft.  Musculoskeletal:        General: Normal range of motion.     Cervical back: Neck supple.  Skin:    General: Skin is warm and dry.     Capillary Refill: Capillary refill takes less than 2 seconds.  Neurological:     General: No focal deficit present.     Mental Status: She is alert and oriented to person, place, and time.  Psychiatric:        Mood and Affect: Mood normal.        Behavior: Behavior normal.      Assessment & Plan:   1. Mild intermittent extrinsic asthma without complication - CBC with Differential/Platelet - Comprehensive metabolic panel -continue Albuterol as prescribed  2. Gastroesophageal reflux disease, unspecified whether esophagitis present - CBC with Differential/Platelet - Comprehensive metabolic panel - pantoprazole (PROTONIX) 40 MG tablet; Take 1 tablet (40 mg total) by mouth daily.  Dispense: 90  tablet; Refill: 3  3. GAD (generalized anxiety disorder) - CBC with Differential/Platelet - Comprehensive metabolic panel - T4, free - TSH -follow-up with psych as scheduled  4. Chronic active hepatitis ([place]) - CBC with Differential/Platelet - Comprehensive metabolic panel - Hepatitis Panel (REFL)  5. Cigarette nicotine dependence with nicotine-induced disorder - CBC with Differential/Platelet - Comprehensive metabolic panel -recommend smoking cessation  6. Attention deficit hyperactivity disorder (ADHD), combined type - CBC with Differential/Platelet - Comprehensive metabolic panel - T4, free - TSH -continue Adderall as prescribed -follow-up with psych as prescribed  7. Need for immunization against influenza - Flu Vaccine QUAD 59mo+IM (Fluarix, Fluzone & Alfiuria Quad PF)  8. History of depression - CBC with Differential/Platelet - Comprehensive metabolic panel - T4, free - TSH -continue Prozac as directed -follow-up with psych as scheduled  9. History of pulmonary embolism - CBC with Differential/Platelet - Comprehensive metabolic panel  10. History of anemia - CBC with Differential/Platelet - Comprehensive metabolic panel - Iron, TIBC and Ferritin Panel  11. BMI 40.0-44.9, adult (HCC) - CBC with Differential/Platelet - Comprehensive metabolic panel - T4, free - TSH - VITAMIN D 25 Hydroxy (Vit-D Deficiency, Fractures)  12. Class 3 severe obesity due to excess calories with serious comorbidity and body mass index (BMI) of 40.0 to 44.9 in adult (HCC) - CBC with Differential/Platelet - Comprehensive metabolic panel - T4, free - TSH - VITAMIN D 25 Hydroxy (Vit-D Deficiency, Fractures)    Continue medications We will call you with lab results Begin Protonix 40 mg daily for GERD Avoid foods that trigger GERD Consider smoking cessation Follow-up in 29-months  I, Rip Harbour, NP, have reviewed all documentation for this visit. The documentation on  07/11/22 for the exam, diagnosis, procedures, and orders are all accurate and complete.    Signed, Jerrell Belfast, DNP

## 2022-07-12 ENCOUNTER — Other Ambulatory Visit: Payer: Self-pay | Admitting: Nurse Practitioner

## 2022-07-12 DIAGNOSIS — J309 Allergic rhinitis, unspecified: Secondary | ICD-10-CM

## 2022-07-12 LAB — CBC WITH DIFFERENTIAL/PLATELET
Basophils Absolute: 0.1 10*3/uL (ref 0.0–0.2)
Basos: 1 %
EOS (ABSOLUTE): 0.2 10*3/uL (ref 0.0–0.4)
Eos: 2 %
Hematocrit: 35.2 % (ref 34.0–46.6)
Hemoglobin: 11.5 g/dL (ref 11.1–15.9)
Immature Grans (Abs): 0 10*3/uL (ref 0.0–0.1)
Immature Granulocytes: 0 %
Lymphocytes Absolute: 2.6 10*3/uL (ref 0.7–3.1)
Lymphs: 34 %
MCH: 28.5 pg (ref 26.6–33.0)
MCHC: 32.7 g/dL (ref 31.5–35.7)
MCV: 87 fL (ref 79–97)
Monocytes Absolute: 0.4 10*3/uL (ref 0.1–0.9)
Monocytes: 5 %
Neutrophils Absolute: 4.5 10*3/uL (ref 1.4–7.0)
Neutrophils: 58 %
Platelets: 351 10*3/uL (ref 150–450)
RBC: 4.03 x10E6/uL (ref 3.77–5.28)
RDW: 14.9 % (ref 11.7–15.4)
WBC: 7.6 10*3/uL (ref 3.4–10.8)

## 2022-07-12 LAB — COMPREHENSIVE METABOLIC PANEL
ALT: 16 IU/L (ref 0–32)
AST: 16 IU/L (ref 0–40)
Albumin/Globulin Ratio: 1.8 (ref 1.2–2.2)
Albumin: 4.2 g/dL (ref 3.9–4.9)
Alkaline Phosphatase: 113 IU/L (ref 44–121)
BUN/Creatinine Ratio: 12 (ref 9–23)
BUN: 9 mg/dL (ref 6–20)
Bilirubin Total: <0.2 mg/dL (ref 0.0–1.2)
CO2: 24 mmol/L (ref 20–29)
Calcium: 9.1 mg/dL (ref 8.7–10.2)
Chloride: 104 mmol/L (ref 96–106)
Creatinine, Ser: 0.78 mg/dL (ref 0.57–1.00)
Globulin, Total: 2.4 g/dL (ref 1.5–4.5)
Glucose: 94 mg/dL (ref 70–99)
Potassium: 4.3 mmol/L (ref 3.5–5.2)
Sodium: 140 mmol/L (ref 134–144)
Total Protein: 6.6 g/dL (ref 6.0–8.5)
eGFR: 101 mL/min/{1.73_m2} (ref >59)

## 2022-07-12 LAB — IRON,TIBC AND FERRITIN PANEL
Ferritin: 7 ng/mL — ABNORMAL LOW (ref 15–150)
Iron Saturation: 5 % — CL (ref 15–55)
Iron: 18 ug/dL — ABNORMAL LOW (ref 27–159)
Total Iron Binding Capacity: 394 ug/dL (ref 250–450)
UIBC: 376 ug/dL (ref 131–425)

## 2022-07-12 LAB — VITAMIN D 25 HYDROXY (VIT D DEFICIENCY, FRACTURES): Vit D, 25-Hydroxy: 31 ng/mL (ref 30.0–100.0)

## 2022-07-12 LAB — TSH: TSH: 1.45 u[IU]/mL (ref 0.450–4.500)

## 2022-07-12 LAB — T4, FREE: Free T4: 0.94 ng/dL (ref 0.82–1.77)

## 2022-07-12 MED ORDER — LORATADINE 10 MG PO TABS: 10.0000 mg | ORAL_TABLET | Freq: Every day | ORAL | 0 refills | Status: DC

## 2022-07-16 ENCOUNTER — Other Ambulatory Visit: Payer: Self-pay

## 2022-07-16 DIAGNOSIS — D508 Other iron deficiency anemias: Secondary | ICD-10-CM

## 2022-07-16 MED ORDER — FUSION PLUS PO CAPS: ORAL_CAPSULE | ORAL | 2 refills | Status: DC

## 2022-07-31 ENCOUNTER — Other Ambulatory Visit: Payer: Medicaid Other

## 2022-07-31 ENCOUNTER — Encounter: Payer: Medicaid Other | Admitting: Oncology

## 2022-08-14 ENCOUNTER — Inpatient Hospital Stay: Payer: Medicaid Other

## 2022-08-14 ENCOUNTER — Inpatient Hospital Stay: Payer: Medicaid Other | Attending: Oncology | Admitting: Oncology

## 2022-08-14 NOTE — Progress Notes (Deleted)
[facility] Initial Visit:  Patient Care Team: Rip Harbour, NP as PCP - General (Nurse Practitioner)  CHIEF COMPLAINTS/PURPOSE OF CONSULTATION:  Oncology History   No history exists.    HISTORY OF PRESENTING ILLNESS: Ana Reyes 36 y.o. female is here because of  ***  Medical history is notable for asthma, cervical dysplasia, endometriosis, hepatitis C untreated, pulmonary embolism, nephrolithiasis, tobacco use, eclampsia (seizure associated with this), obesity,  June 17, 2022: Presented to [place] ED with vaginal bleeding due to miscarriage 3 days prior.  Hemogram showed WBC 8.1 hemoglobin 12.2 platelet count 355  July 11, 2022 WBC 7.6 hemoglobin 11.5 MCV 87 platelet count 351; 58 segs 24 lymphs 5 monos 2 eos 1 basophil CMP normal.   Ferritin 7  August 14, 2022: [facility]  Hematology Consult Patient referred for management of iron deficiency.  Patient is G P Menarche at  Menopause not reached Menses occur every days and last days and are regular/irregular.  Bleeding is light, moderate/ heavy.  Changes pads/ tampons  times a day during the first  days.  Does/ Does not have bleeding between periods.    No/Does pass clots Last pregnancy was delivered by NSVD/ C section on  Patient has/does not have history of uterine fibroids/uterine abnormalities.   Has/ has not taken oral iron/ received IV iron/ required PRBC's in the past.   Has /has not tolerated oral iron owing to GI intolerance.    No history/ has had a reaction to IV iron.   Has/ Does have a normal diet No history/ history of postpartum hemorrhage requiring transfusion.   No history / history of hemorrhage postoperatively requiring transfusion.     No hematochezia, melena, hemoptysis, hematuria.   No /has history of intra-articular or soft tissue bleeding No/has history of abnormal bleeding in family members Patient has symptoms of fatigue, pallor,  DOE, decreased performance  status.  Patient has PICA to ice/starch/dirt.      Review of Systems - Oncology  MEDICAL HISTORY: Past Medical History:  Diagnosis Date   Asthma    Cervical dysplasia    Endometriosis    Hx of hepatitis C    Hx pulmonary embolism    Kidney stones     SURGICAL HISTORY: Past Surgical History:  Procedure Laterality Date   CHOLECYSTECTOMY     GASTRIC BYPASS     LAPAROSCOPY      SOCIAL HISTORY: Social History   Socioeconomic History   Marital status: Single    Spouse name: Not on file   Number of children: 2   Years of education: Not on file   Highest education level: Not on file  Occupational History   Not on file  Tobacco Use   Smoking status: Every Day    Packs/day: 1.00    Types: Cigarettes   Smokeless tobacco: Never  Substance and Sexual Activity   Alcohol use: No   Drug use: No   Sexual activity: Yes    Partners: Male  Other Topics Concern   Not on file  Social History Narrative   Not on file   Social Determinants of Health   Financial Resource Strain: Low Risk  (07/11/2022)   Overall Financial Resource Strain (CARDIA)    Difficulty of Paying Living Expenses: Not hard at all  Food Insecurity: No Food Insecurity (07/11/2022)   Hunger Vital Sign    Worried About Running Out of Food in the Last Year: Never true    Ran  Out of Food in the Last Year: Never true  Transportation Needs: No Transportation Needs (07/11/2022)   PRAPARE - Hydrologist (Medical): No    Lack of Transportation (Non-Medical): No  Physical Activity: Inactive (07/11/2022)   Exercise Vital Sign    Days of Exercise per Week: 0 days    Minutes of Exercise per Session: 0 min  Stress: No Stress Concern Present (07/11/2022)   [place]    Feeling of Stress : Not at all  Social Connections: Moderately Integrated (07/11/2022)   Social Connection and Isolation Panel [NHANES]    Frequency of  Communication with Friends and Family: More than three times a week    Frequency of Social Gatherings with Friends and Family: More than three times a week    Attends Religious Services: More than 4 times per year    Active Member of [employer] or Organizations: No    Attends Archivist Meetings: Never    Marital Status: Living with partner  Intimate Partner Violence: Not At Risk (07/11/2022)   Humiliation, Afraid, Rape, and Kick questionnaire    Fear of Current or Ex-Partner: No    Emotionally Abused: No    Physically Abused: No    Sexually Abused: No    FAMILY HISTORY Family History  Problem Relation Age of Onset   Anxiety disorder Mother    Anxiety disorder Father    ADD / ADHD Father    Bipolar disorder Sister    ADD / ADHD Brother    Anxiety disorder Brother    Heart attack Paternal Grandmother     ALLERGIES:  is allergic to latex, morphine and related, toradol [ketorolac tromethamine], and tramadol.  MEDICATIONS:  Current Outpatient Medications  Medication Sig Dispense Refill   albuterol (PROVENTIL) (5 MG/ML) 0.5% nebulizer solution Take 2.5 mg by nebulization every 4 (four) hours as needed for wheezing or shortness of breath.     albuterol (VENTOLIN HFA) 108 (90 Base) MCG/ACT inhaler Inhale 2 puffs into the lungs every 6 (six) hours as needed (SOB/Wheezing). 1 each 2   ALPRAZolam (XANAX) 1 MG tablet Take 1 mg by mouth 2 (two) times daily as needed for anxiety.     amphetamine-dextroamphetamine (ADDERALL) 20 MG tablet Take by mouth.     calcium carbonate (TUMS - DOSED IN MG ELEMENTAL CALCIUM) 500 MG chewable tablet Chew 2 tablets by mouth 3 (three) times daily as needed for indigestion or heartburn.     cyclobenzaprine (FLEXERIL) 5 MG tablet Take 5 mg by mouth 3 (three) times daily as needed for muscle spasms.     FLUoxetine (PROZAC) 20 MG capsule Take by mouth as needed.     gabapentin (NEURONTIN) 300 MG capsule Take 2 capsules (600 mg total) by mouth 3 (three) times  daily. 90 capsule 1   Iron-FA-B Cmp-C-Biot-Probiotic (FUSION PLUS) CAPS Take 1 capsule by mouth daily 30 capsule 2   loratadine (CLARITIN) 10 MG tablet Take 1 tablet (10 mg total) by mouth daily. 90 tablet 0   pantoprazole (PROTONIX) 40 MG tablet Take 1 tablet (40 mg total) by mouth daily. 90 tablet 3   No current facility-administered medications for this visit.    PHYSICAL EXAMINATION:  ECOG PERFORMANCE STATUS: {CHL ONC ECOG [id number]}   There were no vitals filed for this visit.  There were no vitals filed for this visit.   Physical Exam   LABORATORY DATA: I have personally reviewed the data  as listed:  No visits with results within 1 Month(s) from this visit.  Latest known visit with results is:  Office Visit on 07/11/2022  Component Date Value Ref Range Status   HM Pap smear 03/14/2017 abnormal   Final   See care everywhere   WBC 07/11/2022 7.6  3.4 - 10.8 x10E3/uL Final   RBC 07/11/2022 4.03  3.77 - 5.28 x10E6/uL Final   Hemoglobin 07/11/2022 11.5  11.1 - 15.9 g/dL Final   Hematocrit 07/11/2022 35.2  34.0 - 46.6 % Final   MCV 07/11/2022 87  79 - 97 fL Final   MCH 07/11/2022 28.5  26.6 - 33.0 pg Final   MCHC 07/11/2022 32.7  31.5 - 35.7 g/dL Final   RDW 07/11/2022 14.9  11.7 - 15.4 % Final   Platelets 07/11/2022 351  150 - 450 x10E3/uL Final   Neutrophils 07/11/2022 58  Not Estab. % Final   Lymphs 07/11/2022 34  Not Estab. % Final   Monocytes 07/11/2022 5  Not Estab. % Final   Eos 07/11/2022 2  Not Estab. % Final   Basos 07/11/2022 1  Not Estab. % Final   Neutrophils Absolute 07/11/2022 4.5  1.4 - 7.0 x10E3/uL Final   Lymphocytes Absolute 07/11/2022 2.6  0.7 - 3.1 x10E3/uL Final   Monocytes Absolute 07/11/2022 0.4  0.1 - 0.9 x10E3/uL Final   EOS (ABSOLUTE) 07/11/2022 0.2  0.0 - 0.4 x10E3/uL Final   Basophils Absolute 07/11/2022 0.1  0.0 - 0.2 x10E3/uL Final   Immature Granulocytes 07/11/2022 0  Not Estab. % Final   Immature Grans (Abs) 07/11/2022 0.0  0.0 -  0.1 x10E3/uL Final   Glucose 07/11/2022 94  70 - 99 mg/dL Final   BUN 07/11/2022 9  6 - 20 mg/dL Final   Creatinine, Ser 07/11/2022 0.78  0.57 - 1.00 mg/dL Final   eGFR 07/11/2022 101  >59 mL/min/1.73 Final   BUN/Creatinine Ratio 07/11/2022 12  9 - 23 Final   Sodium 07/11/2022 140  134 - 144 mmol/L Final   Potassium 07/11/2022 4.3  3.5 - 5.2 mmol/L Final   Chloride 07/11/2022 104  96 - 106 mmol/L Final   CO2 07/11/2022 24  20 - 29 mmol/L Final   Calcium 07/11/2022 9.1  8.7 - 10.2 mg/dL Final   Total Protein 07/11/2022 6.6  6.0 - 8.5 g/dL Final   Albumin 07/11/2022 4.2  3.9 - 4.9 g/dL Final   Globulin, Total 07/11/2022 2.4  1.5 - 4.5 g/dL Final   Albumin/Globulin Ratio 07/11/2022 1.8  1.2 - 2.2 Final   Bilirubin Total 07/11/2022 <0.2  0.0 - 1.2 mg/dL Final   Alkaline Phosphatase 07/11/2022 113  44 - 121 IU/L Final   AST 07/11/2022 16  0 - 40 IU/L Final   ALT 07/11/2022 16  0 - 32 IU/L Final   Free T4 07/11/2022 0.94  0.82 - 1.77 ng/dL Final   TSH 07/11/2022 1.450  0.450 - 4.500 uIU/mL Final   Vit D, 25-Hydroxy 07/11/2022 31.0  30.0 - 100.0 ng/mL Final   Comment: Vitamin D deficiency has been defined by the [place] practice guideline as a level of serum 25-OH vitamin D less than 20 ng/mL (1,2). The Endocrine Society went on to further define vitamin D insufficiency as a level between 21 and 29 ng/mL (2). 1. IOM (Institute of Medicine). 2010. Dietary reference    intakes for calcium and D. [place]: The    [employer]. 2. Holick MF, Binkley  [place], Bischoff-Ferrari HA, et al.    Evaluation, treatment, and prevention of vitamin D    deficiency: an Endocrine Society clinical practice    guideline. JCEM. 2011 Jul; 96(7):1911-30.    Total Iron Binding Capacity 07/11/2022 394  250 - 450 ug/dL Final   UIBC 07/11/2022 376  131 - 425 ug/dL Final   Iron 07/11/2022 18 (L)  27 - 159 ug/dL Final   Iron Saturation 07/11/2022 5 (LL)  15 - 55 %  Final   Ferritin 07/11/2022 7 (L)  15 - 150 ng/mL Final    RADIOGRAPHIC STUDIES: I have personally reviewed the radiological images as listed and agree with the findings in the report  No results found.  ASSESSMENT/PLAN  Patient is a 36 year old female with iron deficiency but without anemia that is presumed to be secondary to dysfunctional uterine bleeding  Iron deficiency:  Most likely etiology is dysfunctional uterine bleeding/ exacerbated by high demand owing to prior pregnancies.  Will obtain B12, folate, retic count  Dysfunctional uterine bleeding:  This is characterized by menses that are prolonged, irregular as well as by heavy bleeding with passage of clots.  Will evaluate for possible bleeding disorder with PT, PTT, Fibrinogen, von Willebrand screen.  Refer to gynecology for evaluation and management  Therapeutics:  Since patient is iron deficient but not anemic will recommend low dose oral iron replacement.       Cancer Staging  No matching staging information was found for the patient.   No problem-specific Assessment & Plan notes found for this encounter.   No orders of the defined types were placed in this encounter.   All questions were answered. The patient knows to call the clinic with any problems, questions or concerns.  This note was electronically signed.    Barbee Cough, MD  08/14/2022 9:05 AM

## 2022-08-29 ENCOUNTER — Other Ambulatory Visit: Payer: Self-pay | Admitting: Nurse Practitioner

## 2022-08-29 DIAGNOSIS — N809 Endometriosis, unspecified: Secondary | ICD-10-CM

## 2022-08-30 ENCOUNTER — Other Ambulatory Visit: Payer: Self-pay

## 2022-08-30 DIAGNOSIS — N809 Endometriosis, unspecified: Secondary | ICD-10-CM

## 2022-10-15 ENCOUNTER — Encounter: Payer: Self-pay | Admitting: Nurse Practitioner

## 2022-10-15 ENCOUNTER — Ambulatory Visit (INDEPENDENT_AMBULATORY_CARE_PROVIDER_SITE_OTHER): Payer: Medicaid Other | Admitting: Nurse Practitioner

## 2022-10-15 VITALS — BP 100/68 | HR 86 | Temp 97.2°F | Ht 63.0 in | Wt 200.8 lb

## 2022-10-15 DIAGNOSIS — B182 Chronic viral hepatitis C: Secondary | ICD-10-CM

## 2022-10-15 DIAGNOSIS — F411 Generalized anxiety disorder: Secondary | ICD-10-CM

## 2022-10-15 DIAGNOSIS — F902 Attention-deficit hyperactivity disorder, combined type: Secondary | ICD-10-CM

## 2022-10-15 DIAGNOSIS — K9589 Other complications of other bariatric procedure: Secondary | ICD-10-CM

## 2022-10-15 DIAGNOSIS — K219 Gastro-esophageal reflux disease without esophagitis: Secondary | ICD-10-CM | POA: Insufficient documentation

## 2022-10-15 DIAGNOSIS — Z8659 Personal history of other mental and behavioral disorders: Secondary | ICD-10-CM | POA: Diagnosis not present

## 2022-10-15 DIAGNOSIS — J4520 Mild intermittent asthma, uncomplicated: Secondary | ICD-10-CM

## 2022-10-15 DIAGNOSIS — Z8619 Personal history of other infectious and parasitic diseases: Secondary | ICD-10-CM

## 2022-10-15 DIAGNOSIS — D508 Other iron deficiency anemias: Secondary | ICD-10-CM

## 2022-10-15 DIAGNOSIS — Z720 Tobacco use: Secondary | ICD-10-CM

## 2022-10-15 DIAGNOSIS — J45909 Unspecified asthma, uncomplicated: Secondary | ICD-10-CM | POA: Insufficient documentation

## 2022-10-15 MED ORDER — DEXLANSOPRAZOLE 30 MG PO CPDR: 30.0000 mg | DELAYED_RELEASE_CAPSULE | Freq: Every day | ORAL | 0 refills | Status: DC

## 2022-10-15 MED ORDER — ALBUTEROL SULFATE HFA 108 (90 BASE) MCG/ACT IN AERS: 2.0000 | INHALATION_SPRAY | Freq: Four times a day (QID) | RESPIRATORY_TRACT | 2 refills | Status: DC | PRN

## 2022-10-15 MED ORDER — VARENICLINE TARTRATE (STARTER) 0.5 MG X 11 & 1 MG X 42 PO TBPK: ORAL_TABLET | ORAL | 0 refills | Status: AC

## 2022-10-15 NOTE — Progress Notes (Signed)
Subjective:  Patient ID: Ana Reyes, female    DOB: October 17, 1985  Age: 37 y.o. MRN: [id number]  Chief Complaint  Patient presents with   Gastroesophageal Reflux   ADD   Anxiety    HPI Patient is here for 3 Month follow up regarding her chronic conditions of depression, Anxiety, ADHD, GERD and tobacco abuse. Patient stated she has stopped taking her Alprazolam 1 mg, Prozac, and adderall 20 mg on her own without consulting a provider and absolutely feeling fine since stopping medication. Patient was diagnosed ADHD when she was 38 , it has been two months she discontinued herself and said she is doing fine. She is not taking Prozac, used to take it as needed.  Patient is taking Gabapentin 300 mg for years,  patient is taking iron supplement, She mentioned after Bariatric surgery was done in 2008, she was always low in iron. Patient's Iron panel was low last time and she is taking iron pill and she is wondering her current status of Iron Panel Patient also mentioned she was diagnosed Jaundice a while ago and her hepatitis C was positive and she is requesting for a hepatitis panel. Denies nausea, vomiting and abdominal pain.  Patient lives home with her Son, 47 yrs old daughter, grandmother. 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 Tobacco abuse: Smoking 1 pack a day since she was 13, smoking cessation discussed. She wants to quit smoking and is open to the suggestions. Anxiety and Depression Follow-up:  She was last seen for anxiety and depression 3 months ago. Current treatment includes Prozac but patient is not taking right now including her Alprazolam 1 mg PRN.  Her GAD score is 0, PHQ is 0 and no complaints of anxiety and depression at all  GAD-7 Results    10/15/2022    1:52 PM 07/11/2022    2:53 PM  GAD-7 Generalized Anxiety Disorder Screening Tool  1. Feeling Nervous, Anxious, or on Edge 0 3  2. Not Being Able to Stop or Control  Worrying 0 1  3. Worrying Too Much About Different Things 0 1  4. Trouble Relaxing 0 0  5. Being So Restless it's Hard To Sit Still 0 1  6. Becoming Easily Annoyed or Irritable 0 1  7. Feeling Afraid As If Something Awful Might Happen 0 0  Total GAD-7 Score 0 7  Difficulty At Work, Home, or Getting  Along With Others? Not difficult at all Not difficult at all    PHQ-9 Scores    10/15/2022    1:51 PM 07/11/2022    2:52 PM  PHQ9 SCORE ONLY  PHQ-9 Total Score 0 0         10/15/2022    1:51 PM 07/11/2022    2:52 PM  Depression screen PHQ 2/9  Decreased Interest 0 0  Down, Depressed, Hopeless 0 0  PHQ - 2 Score 0 0  Altered sleeping 0 0  Tired, decreased energy 0 0  Change in appetite 0 0  Feeling bad or failure about yourself  0 0  Trouble concentrating 0 0  Moving slowly or fidgety/restless 0 0  Suicidal thoughts 0 0  PHQ-9 Score 0 0  Difficult doing work/chores Not difficult at all Not difficult at all         11/19/2018    8:35 PM 11/20/2018    7:46 PM 11/21/2018    9:39 AM 07/11/2022  2:52 PM 10/15/2022    1:51 PM  [place] in the past year?    0 0  Was there an injury with Fall?    0 0  Fall Risk Category Calculator    0 0  Fall Risk Category (Retired)    Low   (RETIRED) Patient Fall Risk Level Low fall risk Moderate fall risk Moderate fall risk Low fall risk   Patient at Risk for Falls Due to    No Fall Risks No Fall Risks  Fall risk Follow up    Falls evaluation completed Falls evaluation completed      Review of Systems  Constitutional:  Negative for fatigue.  HENT:  Negative for congestion, ear pain and sore throat.   Respiratory:  Negative for cough and shortness of breath.   Cardiovascular:  Negative for chest pain.  Gastrointestinal:  Negative for abdominal pain, constipation, diarrhea, nausea and vomiting.  Genitourinary:  Negative for dysuria, frequency and urgency.  Musculoskeletal:  Negative for arthralgias, back pain and myalgias.   Neurological:  Negative for dizziness and headaches.  Psychiatric/Behavioral:  Negative for agitation and sleep disturbance. The patient is not nervous/anxious.     Current Outpatient Medications on File Prior to Visit  Medication Sig Dispense Refill   albuterol (PROVENTIL) (5 MG/ML) 0.5% nebulizer solution Take 2.5 mg by nebulization every 4 (four) hours as needed for wheezing or shortness of breath.     calcium carbonate (TUMS - DOSED IN MG ELEMENTAL CALCIUM) 500 MG chewable tablet Chew 2 tablets by mouth 3 (three) times daily as needed for indigestion or heartburn.     gabapentin (NEURONTIN) 300 MG capsule TAKE 2 CAPSULES(600 MG) BY MOUTH THREE TIMES DAILY 90 capsule 1   Iron-FA-B Cmp-C-Biot-Probiotic (FUSION PLUS) CAPS Take 1 capsule by mouth daily 30 capsule 2   loratadine (CLARITIN) 10 MG tablet Take 1 tablet (10 mg total) by mouth daily. 90 tablet 0   No current facility-administered medications on file prior to visit.   Past Medical History:  Diagnosis Date   Asthma    Cervical dysplasia    Endometriosis    Hx of hepatitis C    Hx pulmonary embolism    Kidney stones    Past Surgical History:  Procedure Laterality Date   CHOLECYSTECTOMY     GASTRIC BYPASS     LAPAROSCOPY      Family History  Problem Relation Age of Onset   Anxiety disorder Mother    Anxiety disorder Father    ADD / ADHD Father    Bipolar disorder Sister    ADD / ADHD Brother    Anxiety disorder Brother    Heart attack Paternal Grandmother    Social History   Socioeconomic History   Marital status: Single    Spouse name: Not on file   Number of children: 2   Years of education: Not on file   Highest education level: Not on file  Occupational History   Not on file  Tobacco Use   Smoking status: Every Day    Packs/day: 1.00    Types: Cigarettes   Smokeless tobacco: Never  Substance and Sexual Activity   Alcohol use: No   Drug use: No   Sexual activity: Yes    Partners: Male  Other  Topics Concern   Not on file  Social History Narrative   Not on file   Social Determinants of Health   Financial Resource Strain: Low Risk  (07/11/2022)  Overall Financial Resource Strain (CARDIA)    Difficulty of Paying Living Expenses: Not hard at all  Food Insecurity: No Food Insecurity (07/11/2022)   Hunger Vital Sign    Worried About Running Out of Food in the Last Year: Never true    Ran Out of Food in the Last Year: Never true  Transportation Needs: No Transportation Needs (07/11/2022)   PRAPARE - Administrator, Civil Service (Medical): No    Lack of Transportation (Non-Medical): No  Physical Activity: Inactive (07/11/2022)   Exercise Vital Sign    Days of Exercise per Week: 0 days    Minutes of Exercise per Session: 0 min  Stress: No Stress Concern Present (07/11/2022)   [employer] of Occupational Health - Occupational Stress Questionnaire    Feeling of Stress : Not at all  Social Connections: Moderately Integrated (07/11/2022)   Social Connection and Isolation Panel [NHANES]    Frequency of Communication with Friends and Family: More than three times a week    Frequency of Social Gatherings with Friends and Family: More than three times a week    Attends Religious Services: More than 4 times per year    Active Member of [employer] or Organizations: No    Attends Engineer, structural: Never    Marital Status: Living with partner    Objective:  BP 100/68 (BP Location: Left Arm, Patient Position: Sitting, Cuff Size: Large)   Pulse 86   Temp (!) 97.2 F (36.2 C) (Temporal)   Ht 5\' 3"  (1.6 m)   Wt 200 lb 12.8 oz (91.1 kg)   SpO2 99%   BMI 35.57 kg/m      10/15/2022    1:50 PM 07/11/2022    2:51 PM 01/27/2019    4:10 PM  BP/Weight  Systolic BP 100 136 88  Diastolic BP 68 86 52  Wt. (Lbs) 200.8 238 164  BMI 35.57 kg/m2 40.85 kg/m2 29.05 kg/m2    Physical Exam Vitals reviewed.  Constitutional:      Appearance: Normal appearance.   Cardiovascular:     Rate and Rhythm: Normal rate and regular rhythm.  Pulmonary:     Effort: Pulmonary effort is normal.     Breath sounds: Normal breath sounds.  Abdominal:     General: Bowel sounds are normal.  Musculoskeletal:        General: Normal range of motion.     Cervical back: Normal range of motion.  Skin:    General: Skin is warm.  Neurological:     Mental Status: She is alert.  Psychiatric:        Mood and Affect: Mood normal.        Behavior: Behavior normal.    Lab Results  Component Value Date   WBC 7.6 07/11/2022   HGB 11.5 07/11/2022   HCT 35.2 07/11/2022   PLT 351 07/11/2022   GLUCOSE 94 07/11/2022   ALT 16 07/11/2022   AST 16 07/11/2022   NA 140 07/11/2022   K 4.3 07/11/2022   CL 104 07/11/2022   CREATININE 0.78 07/11/2022   BUN 9 07/11/2022   CO2 24 07/11/2022   TSH 1.450 07/11/2022   HGBA1C 5.5 11/12/2018      Assessment & Plan:    Gastroesophageal reflux disease, unspecified whether esophagitis present Assessment & Plan: Medicine changed to Dexlansoprazole from protonix since protonix not working for the patient   Suggested to quit smoking, caffenated beverages and fried/spicy foods  Orders: -  Dexlansoprazole; Take 1 capsule (30 mg total) by mouth daily.  Dispense: 90 capsule; Refill: 0 -     CBC with Differential/Platelet -     Comprehensive metabolic panel  Attention deficit hyperactivity disorder (ADHD), combined type Assessment & Plan: Patient is not under any medicines now  ADHD well controlled   GAD (generalized anxiety disorder) Assessment & Plan: GAD score is 0  Does not take any medicines for GAD Well controlled   History of depression Assessment & Plan: Well controlled PHQ 0    Tobacco abuse Assessment & Plan: Current 1 pack cigarettes now Smoking cessation discussed Chantix starter pack started  Patient is very positive regarding quit smoking  Orders: -     Varenicline Tartrate (Starter); Take  0.5 mg by mouth daily for 3 days, THEN 0.5 mg 2 (two) times daily for 4 days, THEN 1 mg 2 (two) times daily for 21 days.  Dispense: 1 each; Refill: 0  Mild intermittent extrinsic asthma without complication -     Albuterol Sulfate HFA; Inhale 2 puffs into the lungs every 6 (six) hours as needed (SOB/Wheezing).  Dispense: 1 each; Refill: 2  Iron deficiency anemia following bariatric surgery Assessment & Plan: Continue iron supplement  Orders: -     Iron, TIBC and Ferritin Panel  History of hepatitis C -     HCV RNA quant  Chronic hepatitis C without hepatic coma (HCC) Assessment & Plan: Will repeat HCV RNA     Meds ordered this encounter  Medications   Dexlansoprazole 30 MG capsule DR    Sig: Take 1 capsule (30 mg total) by mouth daily.    Dispense:  90 capsule    Refill:  0    Order Specific Question:   Supervising Provider    Answer:   Shelton Silvas   albuterol (VENTOLIN HFA) 108 (90 Base) MCG/ACT inhaler    Sig: Inhale 2 puffs into the lungs every 6 (six) hours as needed (SOB/Wheezing).    Dispense:  1 each    Refill:  2    Order Specific Question:   Supervising Provider    Answer:   Shelton Silvas   Varenicline Tartrate, Starter, (CHANTIX STARTING MONTH PAK) 0.5 MG X 11 & 1 MG X 42 TBPK    Sig: Take 0.5 mg by mouth daily for 3 days, THEN 0.5 mg 2 (two) times daily for 4 days, THEN 1 mg 2 (two) times daily for 21 days.    Dispense:  1 each    Refill:  0    Order Specific Question:   Supervising Provider    AnswerShelton Silvas    Orders Placed This Encounter  Procedures   CBC with Differential/Platelet   Comprehensive metabolic panel   Iron, TIBC and Ferritin Panel   HCV RNA quant     Follow-up: Return in about 4 weeks (around 11/12/2022) for PAP.  I, Neil Crouch have reviewed all documentation for this visit. The documentation on 10/15/22   for the exam, diagnosis, procedures, and orders are all accurate and complete.     An After  Visit Summary was printed and given to the patient.  Neil Crouch, [place] [phone]

## 2022-10-15 NOTE — Assessment & Plan Note (Signed)
Patient is not under any medicines now  ADHD well controlled

## 2022-10-15 NOTE — Patient Instructions (Signed)
Varenicline Tablets What is this medication? VARENICLINE (var e NI kleen) helps you quit smoking. It reduces cravings for nicotine, the addictive substance found in tobacco. It is most effective when used in combination with a stop-smoking program. This medicine may be used for other purposes; ask your health care provider or pharmacist if you have questions. COMMON BRAND NAME(S): Chantix What should I tell my care team before I take this medication? They need to know if you have any of these conditions: Heart disease Frequently drink alcohol Kidney disease Mental health condition On hemodialysis Seizures History of stroke Suicidal thoughts, plans, or attempt by you or a family member An unusual or allergic reaction to varenicline, other medications, foods, dyes, or preservatives Pregnant or trying to get pregnant Breast-feeding How should I use this medication? Take this medication by mouth after eating. Take with a full glass of water. Follow the directions on the prescription label. Take your doses at regular intervals. Do not take your medication more often than directed. There are 3 ways you can use this medication to help you quit smoking; talk to your care team to decide which plan is right for you: 1) you can choose a quit date and start this medication 1 week before the quit date, or, 2) you can start taking this medication before you choose a quit date, and then pick a quit date between day 8 and 35 days of treatment, or, 3) if you are not sure that you are able or willing to quit smoking right away, start taking this medication and slowly decrease the amount you smoke as directed by your care team with the goal of being cigarette-free by week 12 of treatment. Stick to your plan; ask about support groups or other ways to help you remain cigarette-free. If you are motivated to quit smoking and did not succeed during a previous attempt with this medication for reasons other than side  effects, or if you returned to smoking after this treatment, speak with your care team about whether another course of this medication may be right for you. A special MedGuide will be given to you by the pharmacist with each prescription and refill. Be sure to read this information carefully each time. Talk to your care team about the use of this medication in children. This medication is not approved for use in children. Overdosage: If you think you have taken too much of this medicine contact a poison control center or emergency room at once. NOTE: This medicine is only for you. Do not share this medicine with others. What if I miss a dose? If you miss a dose, take it as soon as you can. If it is almost time for your next dose, take only that dose. Do not take double or extra doses. What may interact with this medication? Alcohol Insulin Other medications used to help people quit smoking Theophylline Warfarin This list may not describe all possible interactions. Give your health care provider a list of all the medicines, herbs, non-prescription drugs, or dietary supplements you use. Also tell them if you smoke, drink alcohol, or use illegal drugs. Some items may interact with your medicine. What should I watch for while using this medication? It is okay if you do not succeed at your attempt to quit and have a cigarette. You can still continue your quit attempt and keep using this medication as directed. Just throw away your cigarettes and get back to your quit plan. Talk to your care team   before using other treatments to quit smoking. Using this medication with other treatments to quit smoking may increase the risk for side effects compared to using a treatment alone. This medication may affect your coordination, reaction time, or judgment. Do not drive or operate machinery until you know how this medication affects you. Sit up or stand slowly to reduce the risk of dizzy or fainting  spells. Decrease the number of alcoholic beverages that you drink during treatment with this medication until you know if this medication affects your ability to tolerate alcohol. Some people have experienced increased drunkenness (intoxication), unusual or sometimes aggressive behavior, or no memory of things that have happened (amnesia) during treatment with this medication. You may do unusual sleep behaviors or activities you do not remember the day after taking this medication. Activities include driving, making or eating food, talking on the phone, sexual activity, or sleep walking. Stop taking this medication and call your care team right away if you find out you have done activities like this. Patients and their families should watch out for new or worsening depression or thoughts of suicide. Also watch out for sudden changes in feelings such as feeling anxious, agitated, panicky, irritable, hostile, aggressive, impulsive, severely restless, overly excited and hyperactive, or not being able to sleep. If this happens, call your care team. If you have diabetes, and you quit smoking, the effects of insulin may be increased. You may need to reduce your insulin dose. Check with your care team about how you should adjust your insulin dose. What side effects may I notice from receiving this medication? Side effects that you should report to your care team as soon as possible: Allergic reactions or angioedema--skin rash, itching or hives, swelling of the face, eyes, lips, tongue, arms, or legs, trouble swallowing or breathing Heart attack--pain or tightness in the chest, shoulders, arms, or jaw, nausea, shortness of breath, cold or clammy skin, feeling faint or lightheaded Mood and behavior changes--anxiety, nervousness, confusion, hallucinations, irritability, hostility, thoughts of suicide or self-harm, worsening mood, feelings of depression Redness, blistering, peeling, or loosening of the skin,  including inside the mouth Stroke--sudden numbness or weakness of the face, arm, or leg, trouble speaking, confusion, trouble walking, loss of balance or coordination, dizziness, severe headache, change in vision Seizures Side effects that usually do not require medical attention (report to your care team if they continue or are bothersome): Constipation Drowsiness Gas Nausea Trouble sleeping Upset stomach Vivid dreams or nightmares Vomiting This list may not describe all possible side effects. Call your doctor for medical advice about side effects. You may report side effects to FDA at 1-800-FDA-1088. Where should I keep my medication? Keep out of the reach of children and pets. Store at room temperature between 15 and 30 degrees C (59 and 86 degrees F). Throw away any unused medication after the expiration date. NOTE: This sheet is a summary. It may not cover all possible information. If you have questions about this medicine, talk to your doctor, pharmacist, or health care provider.  2023 Elsevier/Gold Standard (2021-07-20 00:00:00)

## 2022-10-15 NOTE — Assessment & Plan Note (Signed)
Medicine changed to [place] from protonix since protonix not working for the patient   Suggested to quit smoking, caffenated beverages and fried/spicy foods

## 2022-10-15 NOTE — Assessment & Plan Note (Signed)
Current 1 pack cigarettes now Smoking cessation discussed Chantix starter pack started  Patient is very positive regarding quit smoking

## 2022-10-15 NOTE — Assessment & Plan Note (Signed)
Will repeat HCV RNA

## 2022-10-15 NOTE — Assessment & Plan Note (Signed)
Well controlled PHQ 0

## 2022-10-15 NOTE — Assessment & Plan Note (Signed)
GAD score is 0  Does not take any medicines for GAD Well controlled

## 2022-10-15 NOTE — Assessment & Plan Note (Signed)
Continue iron supplement.

## 2022-10-16 LAB — CBC WITH DIFFERENTIAL/PLATELET
Basophils Absolute: 0 10*3/uL (ref 0.0–0.2)
Basos: 0 %
EOS (ABSOLUTE): 0.2 10*3/uL (ref 0.0–0.4)
Eos: 2 %
Hematocrit: 41.8 % (ref 34.0–46.6)
Hemoglobin: 13.6 g/dL (ref 11.1–15.9)
Immature Grans (Abs): 0 10*3/uL (ref 0.0–0.1)
Immature Granulocytes: 0 %
Lymphocytes Absolute: 3.5 10*3/uL — ABNORMAL HIGH (ref 0.7–3.1)
Lymphs: 43 %
MCH: 29.8 pg (ref 26.6–33.0)
MCHC: 32.5 g/dL (ref 31.5–35.7)
MCV: 92 fL (ref 79–97)
Monocytes Absolute: 0.5 10*3/uL (ref 0.1–0.9)
Monocytes: 6 %
Neutrophils Absolute: 3.9 10*3/uL (ref 1.4–7.0)
Neutrophils: 49 %
Platelets: 374 10*3/uL (ref 150–450)
RBC: 4.56 x10E6/uL (ref 3.77–5.28)
RDW: 15.5 % — ABNORMAL HIGH (ref 11.7–15.4)
WBC: 8.1 10*3/uL (ref 3.4–10.8)

## 2022-10-16 LAB — COMPREHENSIVE METABOLIC PANEL
ALT: 159 IU/L — ABNORMAL HIGH (ref 0–32)
AST: 125 IU/L — ABNORMAL HIGH (ref 0–40)
Albumin/Globulin Ratio: 1.9 (ref 1.2–2.2)
Albumin: 4.9 g/dL (ref 3.9–4.9)
Alkaline Phosphatase: 98 IU/L (ref 44–121)
BUN/Creatinine Ratio: 11 (ref 9–23)
BUN: 11 mg/dL (ref 6–20)
Bilirubin Total: <0.2 mg/dL (ref 0.0–1.2)
CO2: 21 mmol/L (ref 20–29)
Calcium: 9.9 mg/dL (ref 8.7–10.2)
Chloride: 102 mmol/L (ref 96–106)
Creatinine, Ser: 0.98 mg/dL (ref 0.57–1.00)
Globulin, Total: 2.6 g/dL (ref 1.5–4.5)
Glucose: 82 mg/dL (ref 70–99)
Potassium: 4.4 mmol/L (ref 3.5–5.2)
Sodium: 142 mmol/L (ref 134–144)
Total Protein: 7.5 g/dL (ref 6.0–8.5)
eGFR: 77 mL/min/{1.73_m2} (ref >59)

## 2022-10-16 LAB — IRON,TIBC AND FERRITIN PANEL
Ferritin: 40 ng/mL (ref 15–150)
Iron Saturation: 28 % (ref 15–55)
Iron: 124 ug/dL (ref 27–159)
Total Iron Binding Capacity: 445 ug/dL (ref 250–450)
UIBC: 321 ug/dL (ref 131–425)

## 2022-10-16 LAB — HCV RNA QUANT
HCV log10: 4.413 log10 IU/mL
Hepatitis C Quantitation: 25900 IU/mL

## 2022-10-18 ENCOUNTER — Other Ambulatory Visit: Payer: Self-pay

## 2022-10-18 DIAGNOSIS — R945 Abnormal results of liver function studies: Secondary | ICD-10-CM

## 2022-11-01 ENCOUNTER — Other Ambulatory Visit: Payer: Medicaid Other

## 2022-11-14 ENCOUNTER — Ambulatory Visit: Payer: Medicaid Other | Admitting: Nurse Practitioner

## 2022-11-14 NOTE — Progress Notes (Deleted)
Subjective:  Patient ID: Ana Reyes, female    DOB: Feb 08, 1986  Age: 37 y.o. MRN: [id number]  CHIEF COMPLAINT: For screening for cervical cancer  History of Present illness: Patient is here for a PAP screen test. Denies any acute medical symptoms on today's visit.   Current Outpatient Medications on File Prior to Visit  Medication Sig Dispense Refill   albuterol (PROVENTIL) (5 MG/ML) 0.5% nebulizer solution Take 2.5 mg by nebulization every 4 (four) hours as needed for wheezing or shortness of breath.     albuterol (VENTOLIN HFA) 108 (90 Base) MCG/ACT inhaler Inhale 2 puffs into the lungs every 6 (six) hours as needed (SOB/Wheezing). 1 each 2   calcium carbonate (TUMS - DOSED IN MG ELEMENTAL CALCIUM) 500 MG chewable tablet Chew 2 tablets by mouth 3 (three) times daily as needed for indigestion or heartburn.     Dexlansoprazole 30 MG capsule DR Take 1 capsule (30 mg total) by mouth daily. 90 capsule 0   gabapentin (NEURONTIN) 300 MG capsule TAKE 2 CAPSULES(600 MG) BY MOUTH THREE TIMES DAILY 90 capsule 1   Iron-FA-B Cmp-C-Biot-Probiotic (FUSION PLUS) CAPS Take 1 capsule by mouth daily 30 capsule 2   loratadine (CLARITIN) 10 MG tablet Take 1 tablet (10 mg total) by mouth daily. 90 tablet 0   No current facility-administered medications on file prior to visit.   Past Medical History:  Diagnosis Date   Asthma    Cervical dysplasia    Endometriosis    Hx of hepatitis C    Hx pulmonary embolism    Kidney stones    Past Surgical History:  Procedure Laterality Date   CHOLECYSTECTOMY     GASTRIC BYPASS     LAPAROSCOPY      Family History  Problem Relation Age of Onset   Anxiety disorder Mother    Anxiety disorder Father    ADD / ADHD Father    Bipolar disorder Sister    ADD / ADHD Brother    Anxiety disorder Brother    Heart attack Paternal Grandmother    Social History   Socioeconomic History   Marital status: Single    Spouse name: Not on file   Number of children: 2    Years of education: Not on file   Highest education level: Not on file  Occupational History   Not on file  Tobacco Use   Smoking status: Every Day    Packs/day: 1.00    Types: Cigarettes   Smokeless tobacco: Never  Substance and Sexual Activity   Alcohol use: No   Drug use: No   Sexual activity: Yes    Partners: Male  Other Topics Concern   Not on file  Social History Narrative   Not on file   Social Determinants of Health   Financial Resource Strain: Low Risk  (07/11/2022)   Overall Financial Resource Strain (CARDIA)    Difficulty of Paying Living Expenses: Not hard at all  Food Insecurity: No Food Insecurity (07/11/2022)   Hunger Vital Sign    Worried About Running Out of Food in the Last Year: Never true    [place] in the Last Year: Never true  Transportation Needs: No Transportation Needs (07/11/2022)   PRAPARE - Hydrologist (Medical): No    Lack of Transportation (Non-Medical): No  Physical Activity: Inactive (07/11/2022)   Exercise Vital Sign    Days of Exercise per Week: 0 days    Minutes of  Exercise per Session: 0 min  Stress: No Stress Concern Present (07/11/2022)   [place]    Feeling of Stress : Not at all  Social Connections: Moderately Integrated (07/11/2022)   Social Connection and Isolation Panel [NHANES]    Frequency of Communication with Friends and Family: More than three times a week    Frequency of Social Gatherings with Friends and Family: More than three times a week    Attends Religious Services: More than 4 times per year    Active Member of [employer] or Organizations: No    Attends Archivist Meetings: Never    Marital Status: Living with partner    Review of Systems  Constitutional:  Negative for chills, diaphoresis, fatigue and fever.  HENT:  Negative for congestion, ear pain and sore throat.   Respiratory:  Negative for cough and  shortness of breath.   Cardiovascular:  Negative for chest pain and palpitations.  Gastrointestinal:  Negative for abdominal pain, constipation, diarrhea, nausea and vomiting.  Endocrine: Negative for polydipsia, polyphagia and polyuria.  Genitourinary:  Negative for dysuria and frequency.  Musculoskeletal:  Negative for arthralgias, back pain and myalgias.  Neurological:  Negative for dizziness and headaches.  Psychiatric/Behavioral:  Negative for dysphoric mood. The patient is not nervous/anxious.      Objective:  There were no vitals taken for this visit.     10/15/2022    1:50 PM 07/11/2022    2:51 PM 01/27/2019    4:10 PM  BP/Weight  Systolic BP 123XX123 [id number]  Diastolic BP 68 86 52  Wt. (Lbs) 200.8 238 164  BMI 35.57 kg/m2 40.85 kg/m2 29.05 kg/m2    Physical Exam Vitals reviewed. Exam conducted with a chaperone present.  Constitutional:      Appearance: Normal appearance.  Neck:     Vascular: No carotid bruit.  Cardiovascular:     Rate and Rhythm: Normal rate and regular rhythm.     Heart sounds: Normal heart sounds.  Pulmonary:     Effort: Pulmonary effort is normal.     Breath sounds: Normal breath sounds.  Abdominal:     General: Bowel sounds are normal.     Palpations: Abdomen is soft.     Tenderness: There is no abdominal tenderness.     Hernia: A hernia is present. There is no hernia in the left inguinal area or right inguinal area.  Genitourinary:    Exam position: Knee-chest position.     Pubic Area: No rash.      Labia:        Right: No rash.        Left: No rash.      Urethra: No urethral pain or urethral swelling.     Vagina: No signs of injury. No vaginal discharge, erythema, tenderness, bleeding, lesions or prolapsed vaginal walls.     Cervix: No cervical motion tenderness, discharge, friability, lesion, erythema or cervical bleeding.     Uterus: Normal. Not deviated, not enlarged, not fixed and not tender.      Adnexa: Right adnexa normal and left  adnexa normal.  Lymphadenopathy:     Lower Body: No right inguinal adenopathy. No left inguinal adenopathy.  Neurological:     Mental Status: She is alert and oriented to person, place, and time.  Psychiatric:        Mood and Affect: Mood normal.        Behavior: Behavior normal.  10/15/2022    1:51 PM 07/11/2022    2:52 PM  Depression screen PHQ 2/9  Decreased Interest 0 0  Down, Depressed, Hopeless 0 0  PHQ - 2 Score 0 0  Altered sleeping 0 0  Tired, decreased energy 0 0  Change in appetite 0 0  Feeling bad or failure about yourself  0 0  Trouble concentrating 0 0  Moving slowly or fidgety/restless 0 0  Suicidal thoughts 0 0  PHQ-9 Score 0 0  Difficult doing work/chores Not difficult at all Not difficult at all       11/19/2018    8:35 PM 11/20/2018    7:46 PM 11/21/2018    9:39 AM 07/11/2022    2:52 PM 10/15/2022    1:51 PM  [place] in the past year?    0 0  Was there an injury with Fall?    0 0  Fall Risk Category Calculator    0 0  Fall Risk Category (Retired)    Low   (RETIRED) Patient Fall Risk Level Low fall risk Moderate fall risk Moderate fall risk Low fall risk   Patient at Risk for Falls Due to    No Fall Risks No Fall Risks  Fall risk Follow up    Falls evaluation completed Falls evaluation completed    Lab Results  Component Value Date   WBC 8.1 10/15/2022   HGB 13.6 10/15/2022   HCT 41.8 10/15/2022   PLT 374 10/15/2022   GLUCOSE 82 10/15/2022   ALT 159 (H) 10/15/2022   AST 125 (H) 10/15/2022   NA 142 10/15/2022   K 4.4 10/15/2022   CL 102 10/15/2022   CREATININE 0.98 10/15/2022   BUN 11 10/15/2022   CO2 21 10/15/2022   TSH 1.450 07/11/2022   HGBA1C 5.5 11/12/2018      Assessment & Plan:   There are no diagnoses linked to this encounter.   Follow-up: No follow-ups on file.  An After Visit Summary was printed and given to the patient.  I, Neil Crouch have reviewed all documentation for this visit. The documentation on  11/14/22   for the exam, diagnosis, procedures, and orders are all accurate and complete.    Neil Crouch, DNP, [facility] [phone]

## 2022-12-10 ENCOUNTER — Other Ambulatory Visit: Payer: Self-pay

## 2022-12-10 DIAGNOSIS — N809 Endometriosis, unspecified: Secondary | ICD-10-CM

## 2022-12-10 MED ORDER — GABAPENTIN 300 MG PO CAPS: ORAL_CAPSULE | ORAL | 1 refills | Status: DC

## 2022-12-14 ENCOUNTER — Other Ambulatory Visit: Payer: Self-pay

## 2022-12-14 MED ORDER — AMPHETAMINE-DEXTROAMPHETAMINE 20 MG PO TABS: 20.0000 mg | ORAL_TABLET | Freq: Every day | ORAL | 0 refills | Status: DC

## 2022-12-14 NOTE — Telephone Encounter (Signed)
Patient is calling requesting refill for adderall, she states that she has not been on the medication in a few months but was told she could call back and let us know if she wanted to be back on it and we could get it sent in.

## 2023-01-10 ENCOUNTER — Encounter: Payer: Self-pay | Admitting: Physician Assistant

## 2023-01-10 ENCOUNTER — Ambulatory Visit (INDEPENDENT_AMBULATORY_CARE_PROVIDER_SITE_OTHER): Payer: Medicaid Other | Admitting: Physician Assistant

## 2023-01-10 VITALS — BP 100/68 | HR 118 | Temp 97.1°F | Ht 63.0 in | Wt 213.0 lb

## 2023-01-10 DIAGNOSIS — K732 Chronic active hepatitis, not elsewhere classified: Secondary | ICD-10-CM

## 2023-01-10 DIAGNOSIS — R0603 Acute respiratory distress: Secondary | ICD-10-CM | POA: Insufficient documentation

## 2023-01-10 DIAGNOSIS — Z86711 Personal history of pulmonary embolism: Secondary | ICD-10-CM | POA: Diagnosis not present
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
## 2023-01-11 ENCOUNTER — Other Ambulatory Visit: Payer: Self-pay

## 2023-01-11 MED ORDER — AMPHETAMINE-DEXTROAMPHETAMINE 20 MG PO TABS: 20.0000 mg | ORAL_TABLET | Freq: Every day | ORAL | 0 refills | Status: DC

## 2023-01-28 ENCOUNTER — Other Ambulatory Visit: Payer: Self-pay

## 2023-01-28 DIAGNOSIS — K219 Gastro-esophageal reflux disease without esophagitis: Secondary | ICD-10-CM

## 2023-01-28 MED ORDER — DEXLANSOPRAZOLE 30 MG PO CPDR: 30.0000 mg | DELAYED_RELEASE_CAPSULE | Freq: Every day | ORAL | 3 refills | Status: DC

## 2023-02-08 ENCOUNTER — Other Ambulatory Visit: Payer: Self-pay

## 2023-02-08 ENCOUNTER — Telehealth: Payer: Self-pay | Admitting: Physician Assistant

## 2023-02-08 MED ORDER — AMPHETAMINE-DEXTROAMPHETAMINE 20 MG PO TABS: 20.0000 mg | ORAL_TABLET | Freq: Every day | ORAL | 0 refills | Status: DC

## 2023-02-08 NOTE — Telephone Encounter (Signed)
Prescription Request  02/08/2023  LOV: 01/10/2023  What is the name of the medication or equipment? amphetamine-dextroamphetamine (ADDERALL) 20 MG tablet   Have you contacted your pharmacy to request a refill? No   Which pharmacy would you like this sent to?  [facility]ORE [phone] - [place], [place] - [address] AT [facility] [address] [place]  [address] Phone: [phone] Fax: [phone]    Patient notified that their request is being sent to the clinical staff for review and that they should receive a response within 2 business days.   Please advise at [facility] [phone]

## 2023-02-13 ENCOUNTER — Other Ambulatory Visit: Payer: Self-pay | Admitting: Family Medicine

## 2023-02-13 DIAGNOSIS — N809 Endometriosis, unspecified: Secondary | ICD-10-CM

## 2023-02-14 ENCOUNTER — Other Ambulatory Visit: Payer: Self-pay

## 2023-02-14 DIAGNOSIS — N809 Endometriosis, unspecified: Secondary | ICD-10-CM

## 2023-02-14 NOTE — Telephone Encounter (Signed)
Prescription Request  02/14/2023  LOV: 01/10/23  What is the name of the medication or equipment? Gabapentin 300 mg  Last filled 12/10/22 #180 - one refill  Have you contacted your pharmacy to request a refill? No   Which pharmacy would you like this sent to?  [facility] DRUG STORE [phone] - [place], [place] - [address] AT [facility] [address] [place] [address] Phone: [phone] Fax: [phone]    Patient notified that their request is being sent to the clinical staff for review and that they should receive a response within 2 business days.

## 2023-02-21 ENCOUNTER — Ambulatory Visit: Payer: Medicaid Other | Admitting: Physician Assistant

## 2023-02-27 ENCOUNTER — Ambulatory Visit: Payer: Medicaid Other | Admitting: Physician Assistant

## 2023-02-27 ENCOUNTER — Encounter: Payer: Self-pay | Admitting: Physician Assistant

## 2023-02-27 VITALS — BP 110/62 | HR 99 | Temp 97.3°F | Ht 63.0 in | Wt 208.8 lb

## 2023-02-27 DIAGNOSIS — Z720 Tobacco use: Secondary | ICD-10-CM

## 2023-02-27 DIAGNOSIS — D508 Other iron deficiency anemias: Secondary | ICD-10-CM

## 2023-02-27 DIAGNOSIS — K219 Gastro-esophageal reflux disease without esophagitis: Secondary | ICD-10-CM

## 2023-02-27 DIAGNOSIS — K732 Chronic active hepatitis, not elsewhere classified: Secondary | ICD-10-CM | POA: Diagnosis not present

## 2023-02-27 DIAGNOSIS — N809 Endometriosis, unspecified: Secondary | ICD-10-CM

## 2023-02-27 DIAGNOSIS — K9589 Other complications of other bariatric procedure: Secondary | ICD-10-CM

## 2023-02-27 DIAGNOSIS — J4520 Mild intermittent asthma, uncomplicated: Secondary | ICD-10-CM

## 2023-02-27 DIAGNOSIS — F902 Attention-deficit hyperactivity disorder, combined type: Secondary | ICD-10-CM | POA: Diagnosis not present

## 2023-02-27 DIAGNOSIS — R5383 Other fatigue: Secondary | ICD-10-CM

## 2023-02-27 MED ORDER — OMEPRAZOLE 40 MG PO CPDR
40.0000 mg | DELAYED_RELEASE_CAPSULE | Freq: Every day | ORAL | 0 refills | Status: DC
Start: 2023-02-27 — End: 2023-07-26

## 2023-02-27 NOTE — Progress Notes (Signed)
Subjective:  Patient ID: Ana Reyes, female    DOB: 06-27-1986  Age: 37 y.o. MRN: [id number]  Chief Complaint  Patient presents with   Medical Management of Chronic Issues   PT NEW TO ME _ REKHA PT HPI Pt in today for chronic follow up --- she had been following with another provider and at last acute visit we reviewed her labwork and her liver enzymes were elevated and HCV levels were elevated - she states she had been told in past she had Hep C but has never been treated or seen GI for further evaluation - will repeat labwork today and go ahead and do referral for management  Pt with history of ADD - she is currently on adderall 20mg  every day and states that medication works well for her - she has been on medication intermittently since being in school  Pt with history of GERD - currently taking dexilant but would like changed back to omeprazole - that seemed to help her better  Pt with history of endometriosis and overdue for pap smear -- she states that she has had to have surgery in past for endometriosis but that was more than 10 years ago - states her pain is flaring up and requests refill of gabapentin for pain - she is advised I will not fill that medication for that reason but will refer her to GYN for further evaluation - she would like to see Dr Lilian Kapur in [place]  Pt with history of iron def anemia after having gastric bypass surgery in 2008 - currently not taking any iron supplements     02/27/2023    3:50 PM 01/10/2023    3:40 PM 10/15/2022    1:51 PM 07/11/2022    2:52 PM  Depression screen PHQ 2/9  Decreased Interest 0 0 0 0  Down, Depressed, Hopeless 0 0 0 0  PHQ - 2 Score 0 0 0 0  Altered sleeping 0  0 0  Tired, decreased energy 0  0 0  Change in appetite 0  0 0  Feeling bad or failure about yourself  0  0 0  Trouble concentrating 0  0 0  Moving slowly or fidgety/restless 0  0 0  Suicidal thoughts 0  0 0  PHQ-9 Score 0  0 0  Difficult doing work/chores Not  difficult at all  Not difficult at all Not difficult at all        11/21/2018    9:39 AM 07/11/2022    2:52 PM 10/15/2022    1:51 PM 01/10/2023    3:40 PM 02/27/2023    3:50 PM  Fall Risk  Falls in the past year?  0 0 0 0  Was there an injury with Fall?  0 0 0 0  Fall Risk Category Calculator  0 0 0 0  Fall Risk Category (Retired)  Low     (RETIRED) Patient Fall Risk Level Moderate fall risk Low fall risk     Patient at Risk for Falls Due to  No Fall Risks No Fall Risks No Fall Risks No Fall Risks  Fall risk Follow up  Falls evaluation completed Falls evaluation completed Falls evaluation completed Falls evaluation completed     ROS CONSTITUTIONAL: Negative for chills, fatigue, fever, unintentional weight gain and unintentional weight loss.  E/N/T: Negative for ear pain, nasal congestion and sore throat.  CARDIOVASCULAR: Negative for chest pain, dizziness, palpitations and pedal edema.  RESPIRATORY: Negative for recent cough and dyspnea.  GASTROINTESTINAL: Negative for abdominal pain, acid reflux symptoms, constipation, diarrhea, nausea and vomiting.  GU - see HPI MSK: Negative for arthralgias and myalgias.  INTEGUMENTARY: Negative for rash.  NEUROLOGICAL: Negative for dizziness and headaches.  PSYCHIATRIC: Negative for sleep disturbance and to question depression screen.  Negative for depression, negative for anhedonia.    Current Outpatient Medications:    albuterol (PROVENTIL) (5 MG/ML) 0.5% nebulizer solution, Take 2.5 mg by nebulization every 4 (four) hours as needed for wheezing or shortness of breath., Disp: , Rfl:    albuterol (VENTOLIN HFA) 108 (90 Base) MCG/ACT inhaler, Inhale 2 puffs into the lungs every 6 (six) hours as needed (SOB/Wheezing)., Disp: 1 each, Rfl: 2   amphetamine-dextroamphetamine (ADDERALL) 20 MG tablet, Take 1 tablet (20 mg total) by mouth daily., Disp: 30 tablet, Rfl: 0   calcium carbonate (TUMS - DOSED IN MG ELEMENTAL CALCIUM) 500 MG chewable tablet, Chew 2  tablets by mouth 3 (three) times daily as needed for indigestion or heartburn., Disp: , Rfl:    loratadine (CLARITIN) 10 MG tablet, Take 1 tablet (10 mg total) by mouth daily., Disp: 90 tablet, Rfl: 0   omeprazole (PRILOSEC) 40 MG capsule, Take 1 capsule (40 mg total) by mouth daily., Disp: 90 capsule, Rfl: 0  Past Medical History:  Diagnosis Date   Asthma    Cervical dysplasia    Endometriosis    Hx of hepatitis C    Hx pulmonary embolism    Kidney stones    Objective:  PHYSICAL EXAM:   BP 110/62 (BP Location: Left Arm, Patient Position: Sitting, Cuff Size: Large)   Pulse 99   Temp (!) 97.3 F (36.3 C) (Temporal)   Ht 5\' 3"  (1.6 m)   Wt 208 lb 12.8 oz (94.7 kg)   SpO2 98%   BMI 36.99 kg/m    GEN: Well nourished, well developed, in no acute distress   Cardiac: RRR; no murmurs, rubs, or gallops,no edema -  Respiratory:  normal respiratory rate and pattern with no distress - normal breath sounds with no rales, rhonchi, wheezes or rubs GI: normal bowel sounds, no masses or tenderness MS: no deformity or atrophy  Skin: warm and dry, no rash  Psych: euthymic mood, appropriate affect and demeanor  Assessment & Plan:    Chronic active hepatitis (HCC) -     Comprehensive metabolic panel -     Acute Viral Hepatitis (HAV, HBV, HCV) -     Hepatitis C antibody -     Ambulatory referral to Gastroenterology  Gastroesophageal reflux disease, unspecified whether esophagitis present -     Omeprazole; Take 1 capsule (40 mg total) by mouth daily.  Dispense: 90 capsule; Refill: 0  Attention deficit hyperactivity disorder (ADHD), combined type Continue adderall Tobacco abuse Recommend smoking cessation Iron deficiency anemia following bariatric surgery -     CBC with Differential/Platelet -     Iron, TIBC and Ferritin Panel  Other fatigue -     CBC with Differential/Platelet -     Comprehensive metabolic panel -     TSH  Endometriosis -     Ambulatory referral to  Gynecology  Mild intermittent extrinsic asthma without complication Continue albuterol prn    Follow-up: Return in about 3 months (around 05/30/2023) for follow-up.  An After Visit Summary was printed and given to the patient.  Jettie Pagan [facility] [phone]

## 2023-02-28 LAB — CBC WITH DIFFERENTIAL/PLATELET
Basophils Absolute: 0 10*3/uL (ref 0.0–0.2)
Basos: 0 %
EOS (ABSOLUTE): 0.2 10*3/uL (ref 0.0–0.4)
Eos: 3 %
Hematocrit: 41.1 % (ref 34.0–46.6)
Hemoglobin: 13.6 g/dL (ref 11.1–15.9)
Immature Grans (Abs): 0 10*3/uL (ref 0.0–0.1)
Immature Granulocytes: 0 %
Lymphocytes Absolute: 2.3 10*3/uL (ref 0.7–3.1)
Lymphs: 32 %
MCH: 32.2 pg (ref 26.6–33.0)
MCHC: 33.1 g/dL (ref 31.5–35.7)
MCV: 97 fL (ref 79–97)
Monocytes Absolute: 0.5 10*3/uL (ref 0.1–0.9)
Monocytes: 7 %
Neutrophils Absolute: 4.2 10*3/uL (ref 1.4–7.0)
Neutrophils: 58 %
Platelets: 313 10*3/uL (ref 150–450)
RBC: 4.22 x10E6/uL (ref 3.77–5.28)
RDW: 12.8 % (ref 11.7–15.4)
WBC: 7.3 10*3/uL (ref 3.4–10.8)

## 2023-02-28 LAB — IRON,TIBC AND FERRITIN PANEL
Ferritin: 40 ng/mL (ref 15–150)
Iron Saturation: 19 % (ref 15–55)
Iron: 72 ug/dL (ref 27–159)
Total Iron Binding Capacity: 381 ug/dL (ref 250–450)
UIBC: 309 ug/dL (ref 131–425)

## 2023-02-28 LAB — ACUTE VIRAL HEPATITIS (HAV, HBV, HCV)
HCV Ab: REACTIVE — AB
Hep A IgM: NEGATIVE
Hep B C IgM: NEGATIVE
Hepatitis B Surface Ag: NEGATIVE

## 2023-02-28 LAB — COMPREHENSIVE METABOLIC PANEL
ALT: 32 IU/L (ref 0–32)
AST: 27 IU/L (ref 0–40)
Albumin: 4.5 g/dL (ref 3.9–4.9)
Alkaline Phosphatase: 104 IU/L (ref 44–121)
BUN/Creatinine Ratio: 12 (ref 9–23)
BUN: 10 mg/dL (ref 6–20)
Bilirubin Total: <0.2 mg/dL (ref 0.0–1.2)
CO2: 22 mmol/L (ref 20–29)
Calcium: 9.3 mg/dL (ref 8.7–10.2)
Chloride: 101 mmol/L (ref 96–106)
Creatinine, Ser: 0.82 mg/dL (ref 0.57–1.00)
Globulin, Total: 2.4 g/dL (ref 1.5–4.5)
Glucose: 82 mg/dL (ref 70–99)
Potassium: 4.2 mmol/L (ref 3.5–5.2)
Sodium: 140 mmol/L (ref 134–144)
Total Protein: 6.9 g/dL (ref 6.0–8.5)
eGFR: 94 mL/min/{1.73_m2} (ref >59)

## 2023-02-28 LAB — TSH: TSH: 1.12 u[IU]/mL (ref 0.450–4.500)

## 2023-02-28 LAB — HCV RT-PCR, QUANT (NON-GRAPH)
HCV log10: 5.456 log10 IU/mL
Hepatitis C Quantitation: 286000 IU/mL

## 2023-02-28 LAB — HEPATITIS C ANTIBODY

## 2023-03-08 ENCOUNTER — Other Ambulatory Visit: Payer: Self-pay

## 2023-03-08 MED ORDER — AMPHETAMINE-DEXTROAMPHETAMINE 20 MG PO TABS: 20.0000 mg | ORAL_TABLET | Freq: Every day | ORAL | 0 refills | Status: DC

## 2023-03-08 NOTE — Telephone Encounter (Signed)
Prescription Request  03/08/2023  LOV: 02/27/23  What is the name of the medication or equipment? Adderall  Have you contacted your pharmacy to request a refill? No   Which pharmacy would you like this sent to?  [facility] STORE [phone] - [place], [place] - [address] AT [facility] [address] [place] [address] Phone: [phone] Fax: [phone]    Patient notified that their request is being sent to the clinical staff for review and that they should receive a response within 2 business days.

## 2023-03-28 LAB — HM PAP SMEAR: HM Pap smear: NORMAL

## 2023-03-28 LAB — RESULTS CONSOLE HPV: CHL HPV: NEGATIVE

## 2023-04-05 ENCOUNTER — Other Ambulatory Visit: Payer: Self-pay

## 2023-04-05 MED ORDER — AMPHETAMINE-DEXTROAMPHETAMINE 20 MG PO TABS: 20.0000 mg | ORAL_TABLET | Freq: Every day | ORAL | 0 refills | Status: DC

## 2023-05-03 ENCOUNTER — Other Ambulatory Visit: Payer: Self-pay

## 2023-05-03 MED ORDER — AMPHETAMINE-DEXTROAMPHETAMINE 20 MG PO TABS: 20.0000 mg | ORAL_TABLET | Freq: Every day | ORAL | 0 refills | Status: DC

## 2023-06-03 ENCOUNTER — Ambulatory Visit: Payer: Medicaid Other | Admitting: Physician Assistant

## 2023-06-07 ENCOUNTER — Ambulatory Visit: Payer: Medicaid Other | Admitting: Family Medicine

## 2023-06-07 ENCOUNTER — Other Ambulatory Visit: Payer: Self-pay

## 2023-06-07 ENCOUNTER — Telehealth: Payer: Self-pay

## 2023-06-07 ENCOUNTER — Ambulatory Visit: Payer: Medicaid Other | Admitting: Physician Assistant

## 2023-06-07 MED ORDER — AMPHETAMINE-DEXTROAMPHETAMINE 20 MG PO TABS: 20.0000 mg | ORAL_TABLET | Freq: Every day | ORAL | 0 refills | Status: DC
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
## 2023-07-22 ENCOUNTER — Other Ambulatory Visit: Payer: Self-pay

## 2023-07-26 ENCOUNTER — Ambulatory Visit: Payer: MEDICAID | Admitting: Physician Assistant

## 2023-07-26 ENCOUNTER — Encounter: Payer: Self-pay | Admitting: Physician Assistant

## 2023-07-26 VITALS — BP 136/82 | HR 108 | Temp 97.8°F | Ht 63.0 in | Wt 215.0 lb

## 2023-07-26 DIAGNOSIS — K219 Gastro-esophageal reflux disease without esophagitis: Secondary | ICD-10-CM

## 2023-07-26 DIAGNOSIS — Z202 Contact with and (suspected) exposure to infections with a predominantly sexual mode of transmission: Secondary | ICD-10-CM

## 2023-07-26 DIAGNOSIS — K732 Chronic active hepatitis, not elsewhere classified: Secondary | ICD-10-CM | POA: Diagnosis not present

## 2023-07-26 DIAGNOSIS — J4520 Mild intermittent asthma, uncomplicated: Secondary | ICD-10-CM

## 2023-07-26 DIAGNOSIS — F902 Attention-deficit hyperactivity disorder, combined type: Secondary | ICD-10-CM

## 2023-07-26 DIAGNOSIS — Z23 Encounter for immunization: Secondary | ICD-10-CM

## 2023-07-26 DIAGNOSIS — N939 Abnormal uterine and vaginal bleeding, unspecified: Secondary | ICD-10-CM

## 2023-07-26 MED ORDER — ALBUTEROL SULFATE HFA 108 (90 BASE) MCG/ACT IN AERS: 2.0000 | INHALATION_SPRAY | Freq: Four times a day (QID) | RESPIRATORY_TRACT | 2 refills | Status: DC | PRN

## 2023-07-26 MED ORDER — AMPHETAMINE-DEXTROAMPHETAMINE 20 MG PO TABS: 20.0000 mg | ORAL_TABLET | Freq: Every day | ORAL | 0 refills | Status: DC

## 2023-07-26 MED ORDER — OMEPRAZOLE 40 MG PO CPDR: 40.0000 mg | DELAYED_RELEASE_CAPSULE | Freq: Every day | ORAL | 0 refills | Status: DC

## 2023-07-26 NOTE — Progress Notes (Signed)
Subjective:  Patient ID: Ana Reyes, female    DOB: September 22, 1985  Age: 37 y.o. MRN: [id number]  Chief Complaint  Patient presents with   Medical Management of Chronic Issues    HPI Pt in today for chronic follow up ---pt has known Hep C and states she has never been treated or seen GI - we did do a referral at last visit to GI but pt missed that appt and now requests a referral to [facility] She is currently having no GI symptoms  Pt with history of ADD - she is currently on adderall 20mg  every day and states that medication works well for her - she has been on medication intermittently since being in school She ran out of medication because she has cancelled last several visits at our office   Pt with history of GERD - states she is doing well on omeprazole  Pt with history of endometriosis and requested appt with GYN in [place].  She did go see him and had a diagnostic laparoscopy in 8/24.  She was told she had a swollen uterus and did not go for follow up appt.  She is now having light pink vaginal bleeding for the past week.  She is concerned she may have STD because she had unprotected sex a few weeks ago but did just complete 10 days of doxycyline.  We will order NAA culture but she is advised to follow up with GYN ASAP  Pt would like flu shot today      07/26/2023   10:19 AM 02/27/2023    3:50 PM 01/10/2023    3:40 PM 10/15/2022    1:51 PM 07/11/2022    2:52 PM  Depression screen PHQ 2/9  Decreased Interest 0 0 0 0 0  Down, Depressed, Hopeless 0 0 0 0 0  PHQ - 2 Score 0 0 0 0 0  Altered sleeping 0 0  0 0  Tired, decreased energy 0 0  0 0  Change in appetite 0 0  0 0  Feeling bad or failure about yourself  0 0  0 0  Trouble concentrating 0 0  0 0  Moving slowly or fidgety/restless 0 0  0 0  Suicidal thoughts 0 0  0 0  PHQ-9 Score 0 0  0 0  Difficult doing work/chores Not difficult at all Not difficult at all  Not difficult at all Not difficult at all         07/11/2022    2:52 PM 10/15/2022    1:51 PM 01/10/2023    3:40 PM 02/27/2023    3:50 PM 07/26/2023   10:19 AM  Fall Risk  Falls in the past year? 0 0 0 0 0  Was there an injury with Fall? 0 0 0 0 0  Fall Risk Category Calculator 0 0 0 0 0  Fall Risk Category (Retired) Low      (RETIRED) Patient Fall Risk Level Low fall risk      Patient at Risk for Falls Due to No Fall Risks No Fall Risks No Fall Risks No Fall Risks No Fall Risks  Fall risk Follow up Falls evaluation completed Falls evaluation completed Falls evaluation completed Falls evaluation completed Falls evaluation completed    CONSTITUTIONAL: Negative for chills, fatigue, fever, unintentional weight gain and unintentional weight loss.  E/N/T: Negative for ear pain, nasal congestion and sore throat.  CARDIOVASCULAR: Negative for chest pain, dizziness, palpitations and pedal edema.  RESPIRATORY: Negative  for recent cough and dyspnea.  GASTROINTESTINAL: Negative for abdominal pain, acid reflux symptoms, constipation, diarrhea, nausea and vomiting. GU - see HPI MSK: Negative for arthralgias and myalgias.  INTEGUMENTARY: Negative for rash.  NEUROLOGICAL: Negative for dizziness and headaches.  PSYCHIATRIC: Negative for sleep disturbance and to question depression screen.  Negative for depression, negative for anhedonia.        Current Outpatient Medications:    albuterol (PROVENTIL) (5 MG/ML) 0.5% nebulizer solution, Take 2.5 mg by nebulization every 4 (four) hours as needed for wheezing or shortness of breath., Disp: , Rfl:    calcium carbonate (TUMS - DOSED IN MG ELEMENTAL CALCIUM) 500 MG chewable tablet, Chew 2 tablets by mouth 3 (three) times daily as needed for indigestion or heartburn., Disp: , Rfl:    loratadine (CLARITIN) 10 MG tablet, Take 1 tablet (10 mg total) by mouth daily., Disp: 90 tablet, Rfl: 0   albuterol (VENTOLIN HFA) 108 (90 Base) MCG/ACT inhaler, Inhale 2 puffs into the lungs every 6 (six) hours as needed  (SOB/Wheezing)., Disp: 1 each, Rfl: 2   amphetamine-dextroamphetamine (ADDERALL) 20 MG tablet, Take 1 tablet (20 mg total) by mouth daily., Disp: 30 tablet, Rfl: 0   omeprazole (PRILOSEC) 40 MG capsule, Take 1 capsule (40 mg total) by mouth daily., Disp: 90 capsule, Rfl: 0  Past Medical History:  Diagnosis Date   Asthma    Cervical dysplasia    Endometriosis    Hx of hepatitis C    Hx pulmonary embolism    Kidney stones    Objective:  PHYSICAL EXAM:   VS: BP 136/82 (BP Location: Left Arm, Patient Position: Sitting)   Pulse (!) 108   Temp 97.8 F (36.6 C) (Temporal)   Ht 5\' 3"  (1.6 m)   Wt 215 lb (97.5 kg)   SpO2 100%   BMI 38.09 kg/m   GEN: Well nourished, well developed, in no acute distress   Cardiac: RRR; no murmurs, rubs, or gallops,no edema -  Respiratory:  normal respiratory rate and pattern with no distress - normal breath sounds with no rales, rhonchi, wheezes or rubs  Skin: warm and dry, no rash  Neuro:  Alert and Oriented x 3 - CN II-Xii grossly intact Psych: euthymic mood, appropriate affect and demeanor   Assessment & Plan:    Chronic active hepatitis (HCC)  -     Ambulatory referral to [facility]  Gastroesophageal reflux disease, unspecified whether esophagitis present -     Omeprazole; Take 1 capsule (40 mg total) by mouth daily.  Dispense: 90 capsule; Refill: 0  Attention deficit hyperactivity disorder (ADHD), combined type Continue adderall Refill given Abnormal vaginal bleeding with endometriosis Follow up with GYN as recommended  Mild intermittent extrinsic asthma without complication Continue albuterol prn- refill given  Possible exposure to STD Urine NAA test done   Need for flu shot Trivalent fluarix given  Follow-up: Return in about 4 months (around 11/23/2023) for follow-up.  An After Visit Summary was printed and given to the patient.  Jettie Pagan [facility] [phone]

## 2023-07-29 ENCOUNTER — Telehealth: Payer: Self-pay

## 2023-07-29 NOTE — Telephone Encounter (Signed)
Called patient made her grandmother Harriett Sine aware, that we have not got results, it looks like no antibiotic was sent in.

## 2023-07-29 NOTE — Telephone Encounter (Signed)
Copied from CRM [id number]. Topic: General - Other >> Jul 29, 2023 10:16 AM Roswell Nickel wrote: Reason for CRM: Patient calling regarding test result and also wants to know if there is an has sent for her antibiotic.

## 2023-07-30 LAB — CT/GC/TV NAA+MYCOPLASMAS URINE
Chlamydia trachomatis, NAA: NEGATIVE
Mycoplasma genitalium NAA: NEGATIVE
Mycoplasma hominis NAA: NEGATIVE
Neisseria gonorrhoeae, NAA: NEGATIVE
Trich vag by NAA: NEGATIVE
Ureaplasma spp NAA: NEGATIVE

## 2023-08-27 ENCOUNTER — Other Ambulatory Visit: Payer: Self-pay | Admitting: Physician Assistant

## 2023-08-27 DIAGNOSIS — F902 Attention-deficit hyperactivity disorder, combined type: Secondary | ICD-10-CM

## 2023-08-27 MED ORDER — AMPHETAMINE-DEXTROAMPHETAMINE 20 MG PO TABS: 20.0000 mg | ORAL_TABLET | Freq: Every day | ORAL | 0 refills | Status: DC

## 2023-08-27 NOTE — Telephone Encounter (Signed)
Copied from CRM [id number]. Topic: Clinical - Medication Refill >> Aug 27, 2023  3:28 PM Donita Brooks wrote: Most Recent Primary Care Visit:  Provider: Marianne Sofia  Department: [facility]  Visit Type: OFFICE VISIT  Date: 07/26/2023  Medication: amphetamine-dextroamphetamine (ADDERALL) 20 MG tablet   Has the patient contacted their pharmacy? Yes (Agent: If no, request that the patient contact the pharmacy for the refill. If patient does not wish to contact the pharmacy document the reason why and proceed with request.) (Agent: If yes, when and what did the pharmacy advise?)  Is this the correct pharmacy for this prescription? Yes If no, delete pharmacy and type the correct one.  This is the patient's preferred pharmacy:  [id number],  - [address] [place] [address] Phone: [phone] Fax: [phone]   Has the prescription been filled recently? No  Is the patient out of the medication? Yes  Has the patient been seen for an appointment in the last year OR does the patient have an upcoming appointment? No  Can we respond through MyChart? Yes  Agent: Please be advised that Rx refills may take up to 3 business days. We ask that you follow-up with your pharmacy.

## 2023-09-23 ENCOUNTER — Other Ambulatory Visit: Payer: Self-pay | Admitting: Physician Assistant

## 2023-09-23 DIAGNOSIS — F902 Attention-deficit hyperactivity disorder, combined type: Secondary | ICD-10-CM

## 2023-09-23 MED ORDER — AMPHETAMINE-DEXTROAMPHETAMINE 20 MG PO TABS: 20.0000 mg | ORAL_TABLET | Freq: Every day | ORAL | 0 refills | Status: DC

## 2023-09-25 ENCOUNTER — Telehealth: Payer: Self-pay | Admitting: Physician Assistant

## 2023-09-25 NOTE — Telephone Encounter (Signed)
 Ana Reyes

## 2023-09-25 NOTE — Telephone Encounter (Signed)
 Left message on voicemail to reschedule her appointment on 11/22/23 with Cyndi Drain, PA

## 2023-10-10 ENCOUNTER — Other Ambulatory Visit: Payer: Self-pay | Admitting: Physician Assistant

## 2023-10-10 DIAGNOSIS — J4520 Mild intermittent asthma, uncomplicated: Secondary | ICD-10-CM

## 2023-10-20 ENCOUNTER — Other Ambulatory Visit: Payer: Self-pay | Admitting: Physician Assistant

## 2023-10-20 DIAGNOSIS — F902 Attention-deficit hyperactivity disorder, combined type: Secondary | ICD-10-CM

## 2023-10-20 DIAGNOSIS — K219 Gastro-esophageal reflux disease without esophagitis: Secondary | ICD-10-CM

## 2023-10-21 MED ORDER — OMEPRAZOLE 40 MG PO CPDR: 40.0000 mg | DELAYED_RELEASE_CAPSULE | Freq: Every day | ORAL | 0 refills | Status: DC

## 2023-10-21 MED ORDER — AMPHETAMINE-DEXTROAMPHETAMINE 20 MG PO TABS: 20.0000 mg | ORAL_TABLET | Freq: Every day | ORAL | 0 refills | Status: DC

## 2023-11-14 ENCOUNTER — Ambulatory Visit: Payer: MEDICAID | Admitting: Physician Assistant

## 2023-11-20 ENCOUNTER — Ambulatory Visit: Payer: MEDICAID | Admitting: Physician Assistant

## 2023-11-21 ENCOUNTER — Ambulatory Visit (INDEPENDENT_AMBULATORY_CARE_PROVIDER_SITE_OTHER): Payer: MEDICAID | Admitting: Physician Assistant

## 2023-11-21 ENCOUNTER — Encounter: Payer: Self-pay | Admitting: Physician Assistant

## 2023-11-21 VITALS — BP 120/72 | HR 92 | Temp 97.1°F | Ht 63.0 in | Wt 229.6 lb

## 2023-11-21 DIAGNOSIS — J4520 Mild intermittent asthma, uncomplicated: Secondary | ICD-10-CM

## 2023-11-21 DIAGNOSIS — K732 Chronic active hepatitis, not elsewhere classified: Secondary | ICD-10-CM | POA: Diagnosis not present

## 2023-11-21 DIAGNOSIS — N926 Irregular menstruation, unspecified: Secondary | ICD-10-CM | POA: Insufficient documentation

## 2023-11-21 DIAGNOSIS — Z3201 Encounter for pregnancy test, result positive: Secondary | ICD-10-CM | POA: Diagnosis not present

## 2023-11-21 LAB — POCT URINE PREGNANCY: Preg Test, Ur: POSITIVE — AB

## 2023-11-21 MED ORDER — PRENATAL COMPLETE PO CPPK: ORAL_CAPSULE | ORAL | 11 refills | Status: AC

## 2023-11-21 MED ORDER — ALBUTEROL SULFATE HFA 108 (90 BASE) MCG/ACT IN AERS: 2.0000 | INHALATION_SPRAY | Freq: Four times a day (QID) | RESPIRATORY_TRACT | 2 refills | Status: DC | PRN

## 2023-11-21 NOTE — Progress Notes (Signed)
 Subjective:  Patient ID: Ana Reyes, female    DOB: 12-Oct-1985  Age: 38 y.o. MRN: [id number]  Chief Complaint  Patient presents with   Medical Management of Chronic Issues    HPI Pt in today after having noted she has gained about 15 pounds and realized she has not had a period 'in awhile' - she cannot recall when she last had a period but possibly in January.  She took a home pregnancy test and it was positive She states overall is feeling well however she is being followed at [facility] medical center at this time - she was referred there for evaluation and treatment of hepatitis C in the fall --- They have not sent [place] any records and all the patient can tell me is that she did have an ultrasound in January that showed she had a fatty liver and that it was inflamed - she is currently on no treatment for Hep C --- will call for records     11/21/2023    1:55 PM 07/26/2023   10:19 AM 02/27/2023    3:50 PM 01/10/2023    3:40 PM 10/15/2022    1:51 PM  Depression screen PHQ 2/9  Decreased Interest 1 0 0 0 0  Down, Depressed, Hopeless 1 0 0 0 0  PHQ - 2 Score 2 0 0 0 0  Altered sleeping 2 0 0  0  Tired, decreased energy 2 0 0  0  Change in appetite 0 0 0  0  Feeling bad or failure about yourself  0 0 0  0  Trouble concentrating 2 0 0  0  Moving slowly or fidgety/restless 2 0 0  0  Suicidal thoughts 0 0 0  0  PHQ-9 Score 10 0 0  0  Difficult doing work/chores Somewhat difficult Not difficult at all Not difficult at all  Not difficult at all        10/15/2022    1:51 PM 01/10/2023    3:40 PM 02/27/2023    3:50 PM 07/26/2023   10:19 AM 11/21/2023    1:55 PM  Fall Risk  Falls in the past year? 0 0 0 0 0  Was there an injury with Fall? 0 0 0 0 0  Fall Risk Category Calculator 0 0 0 0 0  Patient at Risk for Falls Due to No Fall Risks No Fall Risks No Fall Risks No Fall Risks No Fall Risks  Fall risk Follow up Falls evaluation completed Falls evaluation completed Falls evaluation completed  Falls evaluation completed Falls evaluation completed     ROS CONSTITUTIONAL: Negative for chills, fatigue, fever,  E/N/T: Negative for ear pain, nasal congestion and sore throat.  CARDIOVASCULAR: Negative for chest pain,  RESPIRATORY: Negative for recent cough and dyspnea.  GASTROINTESTINAL: Negative for abdominal pain, acid reflux symptoms, constipation, diarrhea, nausea and vomiting.     Current Outpatient Medications:    albuterol (PROVENTIL) (5 MG/ML) 0.5% nebulizer solution, Take 2.5 mg by nebulization every 4 (four) hours as needed for wheezing or shortness of breath., Disp: , Rfl:    azithromycin (ZITHROMAX) 250 MG tablet, Take by mouth as directed., Disp: , Rfl:    calcium carbonate (TUMS - DOSED IN MG ELEMENTAL CALCIUM) 500 MG chewable tablet, Chew 2 tablets by mouth 3 (three) times daily as needed for indigestion or heartburn., Disp: , Rfl:    loratadine (CLARITIN) 10 MG tablet, Take 1 tablet (10 mg total) by mouth daily., Disp: 90 tablet, Rfl:  0   omeprazole (PRILOSEC) 40 MG capsule, Take 1 capsule (40 mg total) by mouth daily., Disp: 90 capsule, Rfl: 0   predniSONE (DELTASONE) 20 MG tablet, Take 20 mg by mouth daily., Disp: , Rfl:    Prenat MV-Min w/Fe-Folate-DHA (PRENATAL COMPLETE) CPPK, 1 po qd, Disp: 30 each, Rfl: 11   albuterol (VENTOLIN HFA) 108 (90 Base) MCG/ACT inhaler, Inhale 2 puffs into the lungs every 6 (six) hours as needed for wheezing or shortness of breath., Disp: 6.7 g, Rfl: 2  Past Medical History:  Diagnosis Date   Asthma    Cervical dysplasia    Endometriosis    Hx of hepatitis C    Hx pulmonary embolism    Kidney stones    Objective:  PHYSICAL EXAM:   BP 120/72 (BP Location: Left Arm, Patient Position: Sitting, Cuff Size: Large)   Pulse 92   Temp (!) 97.1 F (36.2 C) (Temporal)   Ht 5\' 3"  (1.6 m)   Wt 229 lb 9.6 oz (104.1 kg)   LMP 09/09/2023 (Approximate)   SpO2 97%   BMI 40.67 kg/m    GEN: Well nourished, well developed, in no acute  distress   Cardiac: RRR; no murmurs, rubs, or gallops,no edema - no significant varicosities Respiratory:  normal respiratory rate and pattern with no distress - normal breath sounds with no rales, rhonchi, wheezes or rubs  Skin: warm and dry, no rash   Psych: euthymic mood, appropriate affect and demeanor  Office Visit on 11/21/2023  Component Date Value Ref Range Status   Preg Test, Ur 11/21/2023 Positive (A)  Negative Final    Assessment & Plan:    Missed period -     POCT urine pregnancy  Positive urine pregnancy test -     Beta hCG quant (ref lab) -     Prenatal Complete; 1 po qd  Dispense: 30 each; Refill: 11 Refer to GYN Mild intermittent extrinsic asthma without complication -     Albuterol Sulfate HFA; Inhale 2 puffs into the lungs every 6 (six) hours as needed for wheezing or shortness of breath.  Dispense: 6.7 g; Refill: 2  Chronic active hepatitis (HCC) Pt to follow up with [facility] as directed Call for records    Follow-up: Return if symptoms worsen or fail to improve.  An After Visit Summary was printed and given to the patient.  Jettie Pagan [facility] [phone]

## 2023-11-22 ENCOUNTER — Ambulatory Visit: Payer: MEDICAID | Admitting: Physician Assistant

## 2023-11-22 LAB — BETA HCG QUANT (REF LAB): hCG Quant: 94747 m[IU]/mL

## 2023-12-18 ENCOUNTER — Telehealth: Payer: MEDICAID

## 2023-12-18 ENCOUNTER — Ambulatory Visit: Payer: Self-pay

## 2023-12-18 ENCOUNTER — Encounter: Payer: Self-pay | Admitting: Family Medicine

## 2023-12-18 NOTE — Telephone Encounter (Signed)
 It is [facility] where she was referred, and the patient is scheduled to see them on 12/25/23 and Katie left a message with Dr. Renea Ee phone number for one of the Dr's to give her a call back. The nurse stated that it would be either Dr. Leota Jacobsen or Dr. Mel Almond to give her a call back.

## 2023-12-18 NOTE — Telephone Encounter (Signed)
 Copied from CRM [id number]. Topic: Clinical - Red Word Triage >> Dec 18, 2023 12:10 PM Elle L wrote: Red Word that prompted transfer to Nurse Triage: The patient had an appointment the other day and she forgot to mention that she needs a prescription for Eliquis as she has a history of blood clots and she is currently pregnant.   Patient states that she has a history of blood clots and pulmonary embolisms and that during previous pregnancies she has been placed on Eliquis of Lovenox as a preventative measure. She states that she had an appointment yesterday and forgot to tell her PCP. I advised that I would let the office know about this information so they can make a decision on care. She understood and is agreeable with this plan.    Reason for Disposition  Nursing judgment or information in reference  Answer Assessment - Initial Assessment Questions 1. REASON FOR CALL: "What is your main concern right now?"     Forgot to tell PCP something during last visit.  2. PREGNANCY: "Is there any chance you are pregnant?" "When was your last menstrual period?"     Yes  Protocols used: No Guideline Available-A-AH

## 2023-12-19 ENCOUNTER — Encounter: Payer: Self-pay | Admitting: Family Medicine

## 2023-12-20 ENCOUNTER — Encounter: Payer: Self-pay | Admitting: Family Medicine

## 2023-12-20 ENCOUNTER — Telehealth: Payer: Self-pay

## 2023-12-20 ENCOUNTER — Other Ambulatory Visit: Payer: Self-pay | Admitting: Family Medicine

## 2023-12-20 MED ORDER — ENOXAPARIN SODIUM 40 MG/0.4ML IJ SOSY: PREFILLED_SYRINGE | INTRAMUSCULAR | 2 refills | Status: AC

## 2023-12-20 NOTE — Telephone Encounter (Signed)
 Put on lovonox.

## 2023-12-20 NOTE — Telephone Encounter (Signed)
 Patient was informed that Dr Sedalia Muta sent prescription of Lovenox to the pharmacy. We received  a call from OB-GYN office saying that it will be ok to start medication today. Patient has an appointment with them on 12/25/23 at 1 pm.

## 2023-12-25 LAB — OB RESULTS CONSOLE GC/CHLAMYDIA: Chlamydia: NEGATIVE

## 2023-12-27 ENCOUNTER — Ambulatory Visit: Payer: Self-pay

## 2023-12-27 NOTE — Telephone Encounter (Signed)
 Chief Complaint: boil Symptoms: boil, pain Frequency: since yesterday Pertinent Negatives: Patient denies fever, chills, shaking, N/V, weakness Disposition: [] ED /[] Urgent Care (no appt availability in office) / [x] Appointment(In office/virtual)/ []  [facility]/ [] Home Care/ [] Refused Recommended Disposition /[] [facility]/ []  Follow-up with PCP Additional Notes: Pt reports a boil between her vagina and rectum since yesterday. Pt states she recently visited her brother who has a problem with boils and used his toilet and thinks she may have caught something from him. Pt endorses 7/10 pain, worse with sitting up. Pt denies drainage, fever, chills, weakness, N/V. Pt is [redacted] wks pregnant. RN advised pt she should be seen within 4 hours and offered her a same-day appt at the office but pt stated she would rather go to UC as that would give her time to get ready and take her of her other child. Pt states she will go to [facility] in [place] within 4 hours. RN advised pt if her pain worsens or if she develops a fever or N/V she should go to the ED instead. Pt verbalized understanding.    Copied from CRM [id number]. Topic: Clinical - Red Word Triage >> Dec 27, 2023  9:39 AM Rosamond Comes wrote: Red Word that prompted transfer to Nurse Triage: patient calling in. Patient has a boil between vagina and rectum, gotten bigger, 7 pain scale.  Patient is pregnant [redacted]weeks. Reason for Disposition  SEVERE pain (e.g., excruciating)  Answer Assessment - Initial Assessment Questions 1. APPEARANCE of BOIL: "What does the boil look like?"      Unable to visualize it  2. LOCATION: "Where is the boil located?"      Between her vagina and rectum 3. NUMBER: "How many boils are there?"      Just one  4. SIZE: "How big is the boil?" (e.g., inches, cm; compare to size of a coin or other object)     Almost the size of a quarter 5. ONSET: "When did the boil start?"     Yesterday - has had one before  6. PAIN:  "Is there any pain?" If Yes, ask: "How bad is the pain?"   (Scale 1-10; or mild, moderate, severe)     7/10 - worse with sitting 7. FEVER: "Do you have a fever?" If Yes, ask: "What is it, how was it measured, and when did it start?"      "Maybe some chills, but it was chilly this AM" 8. SOURCE: "Have you been around anyone with boils or other Staph infections?" "Have you ever had boils before?"     States she's had one previously, but it's "been a while", states her brother has a lot of problems with boils in the past and she used his toilet and visited him recently  9. OTHER SYMPTOMS: "Do you have any other symptoms?" (e.g., shaking chills, weakness, rash elsewhere on body)     No N/V. Denies weakness, shaking, rash. 10. PREGNANCY: "Is there any chance you are pregnant?" "When was your last menstrual period?"       Yes, [redacted] wks pregnant  Protocols used: Boil (Skin Abscess)-A-AH

## 2024-01-13 ENCOUNTER — Other Ambulatory Visit: Payer: Self-pay

## 2024-01-14 MED ORDER — ALBUTEROL SULFATE (5 MG/ML) 0.5% IN NEBU: 2.5000 mg | INHALATION_SOLUTION | Freq: Four times a day (QID) | RESPIRATORY_TRACT | 2 refills | Status: DC | PRN

## 2024-01-16 ENCOUNTER — Other Ambulatory Visit: Payer: Self-pay | Admitting: Physician Assistant

## 2024-01-16 DIAGNOSIS — K219 Gastro-esophageal reflux disease without esophagitis: Secondary | ICD-10-CM

## 2024-01-23 ENCOUNTER — Encounter: Payer: Self-pay | Admitting: Physician Assistant

## 2024-01-23 ENCOUNTER — Ambulatory Visit (INDEPENDENT_AMBULATORY_CARE_PROVIDER_SITE_OTHER): Payer: MEDICAID | Admitting: Physician Assistant

## 2024-01-23 ENCOUNTER — Encounter (HOSPITAL_BASED_OUTPATIENT_CLINIC_OR_DEPARTMENT_OTHER): Payer: Self-pay

## 2024-01-23 ENCOUNTER — Ambulatory Visit: Payer: Self-pay

## 2024-01-23 VITALS — BP 108/62 | HR 114 | Temp 97.5°F | Ht 63.0 in | Wt 240.0 lb

## 2024-01-23 DIAGNOSIS — Z3201 Encounter for pregnancy test, result positive: Secondary | ICD-10-CM | POA: Diagnosis not present

## 2024-01-23 DIAGNOSIS — Z86711 Personal history of pulmonary embolism: Secondary | ICD-10-CM

## 2024-01-23 DIAGNOSIS — J4551 Severe persistent asthma with (acute) exacerbation: Secondary | ICD-10-CM

## 2024-01-23 MED ORDER — CETIRIZINE HCL 10 MG PO TABS: 10.0000 mg | ORAL_TABLET | Freq: Every day | ORAL | 3 refills | Status: AC

## 2024-01-23 MED ORDER — PREDNISONE 20 MG PO TABS: ORAL_TABLET | ORAL | 0 refills | Status: DC

## 2024-01-23 MED ORDER — TRIAMCINOLONE ACETONIDE 40 MG/ML IJ SUSP
40.0000 mg | Freq: Once | INTRAMUSCULAR | Status: AC
  Administered 2024-01-23: 40 mg via INTRAMUSCULAR

## 2024-01-23 NOTE — Assessment & Plan Note (Signed)
 Asthma exacerbation with shortness of breath and wheezing. Pregnancy complicates treatment. Risks of high-dose steroids discussed. Differential includes allergy or asthma exacerbation. Pulmonary embolism ruled out due to anticoagulation and pregnancy. - Administer steroid injection (Kenalog). - Prescribe low-dose oral prednisone. - Refer urgently to pulmonology. - Prescribe saline nebulizer for symptomatic relief.

## 2024-01-23 NOTE — Assessment & Plan Note (Signed)
 [redacted] weeks pregnant, complicating asthma management. Discussed risks and benefits of steroid treatment. Shortness of breath impacts daily activities and family care.

## 2024-01-23 NOTE — Telephone Encounter (Signed)
 Chief Complaint: asthma, cough Symptoms: productive cough with fits that make her vomit, hoarse voice, nasal and chest congestion, moderate SOB Frequency: x 2 months Pertinent Negatives: Patient denies N/A. Disposition: ED /[] Urgent Care (no appt availability in office) / [] Appointment(In office/virtual)/ []  [facility] [facility]/ [] Home Care/ [] Refused Recommended Disposition /[] [facility]/ []  Follow-up with PCP Additional Notes: Patient states she has been to urgent care 3 times and ED twice. She states she has been on several rounds of antibiotics and prednisone, she states the prednisone has been the only thing that feels like it has helped. Patient states she was seen at [facility] yesterday and he thinks she should go to asthma and allergy specialist but didn't want to refer the patient without PCP agreeing. Called [facility] and confirmed with staff okay for patient to come in for acute visit as soon as available instead of sending to ED. Patient speaking in full phrases/sentences, no gasping or wheezing noted during call. Patient states she has used inhalers and neb treatments today but is having to use them every 2 hours.  Copied from CRM [id number]. Topic: Clinical - Red Word Triage >> Jan 23, 2024 11:16 AM Donald Frost wrote: Red Word that prompted transfer to Nurse Triage: The patient called in stating she has been seen twice in the last couple of weeks at 2 different hospitals for upper respiratory issues. She complains of shortness of breath, bad cough and emesis. She states she has taken an antibiotic and used 2 different inhalers but it is barely helping. She is still struggling and she is alos [redacted] weeks pregnant. I will transfer her to E2C2 NT Reason for Disposition  [1] MODERATE asthma attack (e.g., SOB at rest, speaks in phrases, audible wheezes) AND [2] not resolved after 2 or 3 inhaler or nebulizer treatments given 20 minutes apart  Answer Assessment - Initial  Assessment Questions 1. RESPIRATORY STATUS: "Describe your breathing?" (e.g., wheezing, shortness of breath, unable to speak, severe coughing)      All of the above: SOB, productive coughing, wheezing.  2. ONSET: "When did this asthma attack begin?"      2 months.  3. TRIGGER: "What do you think triggered this attack?" (e.g., URI, exposure to pollen or other allergen, tobacco smoke)      ED told her she had rhinovirus that has turned into bronchitis.  4. PEAK EXPIRATORY FLOW RATE (PEFR): "Do you use a peak flow meter?" If Yes, ask: "What's the current peak flow? What's your personal best peak flow?"      No.  5. SEVERITY: "How bad is this attack?"    - MILD: No SOB at rest, mild SOB with walking, speaks normally in sentences, can lie down, no retractions, pulse < 100. (GREEN Zone: PEFR 80-100%)   - MODERATE: SOB at rest, SOB with minimal exertion and prefers to sit, cannot lie down flat, speaks in phrases, mild retractions, audible wheezing, pulse 100-120. (YELLOW Zone: PEFR 50-79%)    - SEVERE: Struggling for each breath, speaks in single words, struggling to breathe, sitting hunched forward, retractions, usually loud wheezing, sometimes minimal wheezing because of decreased air movement, pulse > 120. (RED Zone: PEFR < 50%).      Moderate.  6. ASTHMA MEDICINES:  "What treatments have you tried?"    - INHALED QUICK RELIEF (RESCUE): "What is your inhaled quick-relief medicine?" (e.g., albuterol , salbutamol) "Do you use an inhaler or a nebulizer?" "How frequently have you been using this medicine?"   -  CONTROLLER (LONG-TERM-CONTROL): "Do you take an inhaled steroid? (e.g., Asmanex, Flovent, Pulmicort, Qvar)     She is using albuterol  and fluticasone inhalers. She states she has been using them every couple of hours and breathing treatments.  7. INHALED QUICK-RELIEF TREATMENTS FOR THIS ATTACK: "What treatments have you given yourself so far?" and "How many and how often?" If using an inhaler,  ask, "How many puffs?" Note: Routine treatments are 2 puffs every 4 hours as needed. Rescue treatments are 4 puffs repeated every 20 minutes, up to three times as needed.      2 puffs every 2 hours and 2 nebulizer treatments today.  8. OTHER SYMPTOMS: "Do you have any other symptoms? (e.g., chest pain, coughing up yellow sputum, fever, runny nose)     Cough, hoarse voice, runny nose, chest congestion/nasal congestion.  9. O2 SATURATION MONITOR:  "Do you use an oxygen saturation monitor (pulse oximeter) at home?" If Yes, "What is your reading (oxygen level) today?" "What is your usual oxygen saturation reading?" (e.g., 95%)     96% and HR 113.  10. PREGNANCY: "Is there any chance you are pregnant?" "When was your last menstrual period?"       [redacted] weeks pregnant.  Protocols used: Asthma Attack-A-AH

## 2024-01-23 NOTE — Assessment & Plan Note (Addendum)
 Low likelihood of pulmonary embolism due to anticoagulation therapy. D-dimer test not pursued due to anticoagulation

## 2024-01-23 NOTE — Progress Notes (Signed)
 Acute Office Visit  Subjective:    Patient ID: Ana Reyes, female    DOB: 15-May-1986, 38 y.o.   MRN: [id number]  Chief Complaint  Patient presents with   cough/wheezing    HPI: Patient is in today for worsening SOB Discussed the use of AI scribe software for clinical note transcription with the patient, who gave verbal consent to proceed.  History of Present Illness   Ana Reyes is a 38 year old female with asthma exacerbation who presents with persistent shortness of breath and wheezing.  She has been experiencing shortness of breath for several months, with worsening symptoms over the past month. Two weeks ago, she developed hoarseness. She has sought care at urgent care and the Emergency room multiple times, receiving steroids like prednisone, which provide temporary relief. However, symptoms recur after discontinuation. She uses albuterol  every two hours and DuoNeb every six hours, which offer some relief.  Currently, she is taking azithromycin, prescribed by her OB, and has previously been on Keflex and other antibiotics without improvement. She coughs up mostly clear sputum with some brown discoloration. A viral panel was negative, and a chest x-ray showed no signs of infection, but bilateral atelectasis   She has a history of pulmonary embolism and has been on Lovenox  for about a month and a half without missing doses. She is [redacted] weeks pregnant and finds it difficult to perform daily tasks due to her breathing issues. She cares for her grandmother and disabled son, which adds to her stress. Her son is scheduled for a tonsillectomy tomorrow, and she is concerned about managing the hospital visit due to her symptoms.  She uses a fluticasone inhaler and takes Benadryl . She needs a refill on Zyrtec. She smokes chronically half a pack a day which may contribute to her respiratory issues.       Past Medical History:  Diagnosis Date   Asthma    Cervical dysplasia     Endometriosis    Hx of hepatitis C    Hx pulmonary embolism    Kidney stones     Past Surgical History:  Procedure Laterality Date   CHOLECYSTECTOMY     GASTRIC BYPASS     LAPAROSCOPY      Family History  Problem Relation Age of Onset   Anxiety disorder Mother    Anxiety disorder Father    ADD / ADHD Father    Bipolar disorder Sister    ADD / ADHD Brother    Anxiety disorder Brother    Heart attack Paternal Grandmother     Social History   Socioeconomic History   Marital status: Single    Spouse name: Not on file   Number of children: 2   Years of education: Not on file   Highest education level: Not on file  Occupational History   Not on file  Tobacco Use   Smoking status: Every Day    Current packs/day: 1.00    Types: Cigarettes   Smokeless tobacco: Never  Substance and Sexual Activity   Alcohol use: No   Drug use: No   Sexual activity: Yes    Partners: Male  Other Topics Concern   Not on file  Social History Narrative   Not on file   Social Drivers of Health   Financial Resource Strain: Low Risk  (02/27/2023)   Overall Financial Resource Strain (CARDIA)    Difficulty of Paying Living Expenses: Not hard at all  Food Insecurity: No Food Insecurity (  02/27/2023)   Hunger Vital Sign    Worried About Running Out of Food in the Last Year: Never true    Ran Out of Food in the Last Year: Never true  Transportation Needs: No Transportation Needs (02/27/2023)   PRAPARE - Administrator, Civil Service (Medical): No    Lack of Transportation (Non-Medical): No  Physical Activity: Inactive (02/27/2023)   Exercise Vital Sign    Days of Exercise per Week: 0 days    Minutes of Exercise per Session: 0 min  Stress: No Stress Concern Present (02/27/2023)   [employer] of Occupational Health - Occupational Stress Questionnaire    Feeling of Stress : Not at all  Social Connections: Moderately Integrated (02/27/2023)   Social Connection and Isolation Panel  [NHANES]    Frequency of Communication with Friends and Family: More than three times a week    Frequency of Social Gatherings with Friends and Family: More than three times a week    Attends Religious Services: More than 4 times per year    Active Member of [employer] or Organizations: No    Attends Banker Meetings: Never    Marital Status: Living with partner  Intimate Partner Violence: Not At Risk (02/27/2023)   Humiliation, Afraid, Rape, and Kick questionnaire    Fear of Current or Ex-Partner: No    Emotionally Abused: No    Physically Abused: No    Sexually Abused: No    Outpatient Medications Prior to Visit  Medication Sig Dispense Refill   albuterol  (PROVENTIL ) (5 MG/ML) 0.5% nebulizer solution Take 0.5 mLs (2.5 mg total) by nebulization every 6 (six) hours as needed for wheezing or shortness of breath. 20 mL 2   albuterol  (VENTOLIN  HFA) 108 (90 Base) MCG/ACT inhaler Inhale 2 puffs into the lungs every 6 (six) hours as needed for wheezing or shortness of breath. 6.7 g 2   azithromycin (ZITHROMAX) 250 MG tablet Take 250 mg by mouth.     calcium carbonate (TUMS - DOSED IN MG ELEMENTAL CALCIUM) 500 MG chewable tablet Chew 2 tablets by mouth 3 (three) times daily as needed for indigestion or heartburn.     diphenhydrAMINE  (BENADRYL ) 25 mg capsule Take 25 mg by mouth at bedtime as needed.     enoxaparin  (LOVENOX ) 40 MG/0.4ML injection 40 MG injection once daily. 12 mL 2   fluticasone (FLOVENT HFA) 220 MCG/ACT inhaler Inhale 2 puffs into the lungs in the morning and at bedtime.     folic acid (FOLVITE) 1 MG tablet Take 1 tablet by mouth daily.     omeprazole  (PRILOSEC) 40 MG capsule TAKE 1 CAPSULE(40 MG) BY MOUTH DAILY 90 capsule 0   Prenat MV-Min w/Fe-Folate-DHA (PRENATAL COMPLETE) CPPK 1 po qd 30 each 11   azithromycin (ZITHROMAX) 250 MG tablet Take by mouth as directed.     loratadine  (CLARITIN ) 10 MG tablet Take 1 tablet (10 mg total) by mouth daily. 90 tablet 0   predniSONE  (DELTASONE) 20 MG tablet Take 20 mg by mouth daily.     No facility-administered medications prior to visit.    Allergies  Allergen Reactions   Latex Other (See Comments)    Causes redness.   Morphine  And Codeine Nausea Only   Toradol [Ketorolac Tromethamine] Rash    Tolerates ibuprofen    Tramadol Nausea And Vomiting and Rash    Review of Systems  Constitutional:  Negative for appetite change, fatigue and fever.  HENT:  Positive for congestion. Negative for ear  pain, sinus pressure and sore throat.   Respiratory:  Positive for cough, shortness of breath and wheezing. Negative for chest tightness.   Cardiovascular:  Negative for chest pain and palpitations.  Gastrointestinal:  Negative for abdominal pain, constipation, diarrhea, nausea and vomiting.  Genitourinary:  Negative for dysuria and hematuria.  Musculoskeletal:  Negative for arthralgias, back pain, joint swelling and myalgias.  Skin:  Negative for rash.  Neurological:  Negative for dizziness, weakness and headaches.  Psychiatric/Behavioral:  Negative for dysphoric mood. The patient is not nervous/anxious.        Objective:         01/23/2024    1:31 PM 11/21/2023    1:49 PM 07/26/2023   10:15 AM  Vitals with BMI  Height 5\' 3"  5\' 3"  5\' 3"   Weight 240 lbs 229 lbs 10 oz 215 lbs  BMI 42.52 40.68 38.09  Systolic 108 120 161  Diastolic 62 72 82  Pulse 114 92 108    Orthostatic VS for the past 72 hrs (Last 3 readings):  Patient Position BP Location  01/23/24 1331 Sitting Right Arm     Physical Exam Vitals reviewed.  Constitutional:      Appearance: Normal appearance.  Cardiovascular:     Rate and Rhythm: Normal rate and regular rhythm.     Heart sounds: Normal heart sounds.  Pulmonary:     Effort: Prolonged expiration present.     Breath sounds: No stridor. Examination of the right-lower field reveals decreased breath sounds. Examination of the left-lower field reveals decreased breath sounds. Decreased  breath sounds and wheezing present. No rhonchi or rales.  Abdominal:     General: Bowel sounds are normal.     Palpations: Abdomen is soft.     Tenderness: There is no abdominal tenderness.  Neurological:     Mental Status: She is alert and oriented to person, place, and time.  Psychiatric:        Mood and Affect: Mood normal.        Behavior: Behavior normal.     Health Maintenance Due  Topic Date Due   Pneumococcal Vaccine 38-71 Years old (2 of 2 - PCV) 11/14/2019    There are no preventive care reminders to display for this patient.   Lab Results  Component Value Date   TSH 1.120 02/27/2023   Lab Results  Component Value Date   WBC 7.3 02/27/2023   HGB 13.6 02/27/2023   HCT 41.1 02/27/2023   MCV 97 02/27/2023   PLT 313 02/27/2023   Lab Results  Component Value Date   NA 140 02/27/2023   K 4.2 02/27/2023   CO2 22 02/27/2023   GLUCOSE 82 02/27/2023   BUN 10 02/27/2023   CREATININE 0.82 02/27/2023   BILITOT <0.2 02/27/2023   ALKPHOS 104 02/27/2023   AST 27 02/27/2023   ALT 32 02/27/2023   PROT 6.9 02/27/2023   ALBUMIN 4.5 02/27/2023   CALCIUM 9.3 02/27/2023   ANIONGAP 8 11/21/2018   EGFR 94 02/27/2023   No results found for: "CHOL" No results found for: "HDL" No results found for: "LDLCALC" No results found for: "TRIG" No results found for: "CHOLHDL" Lab Results  Component Value Date   HGBA1C 5.5 11/12/2018   Total time spent on today's visit was 40 minutes, including both face-to-face time and nonface-to-face time personally spent on review of chart (labs and imaging), discussing labs and goals, discussing further work-up, treatment options, referrals to specialist if needed, reviewing outside records of pertinent,  answering patient's questions, and coordinating care.     Assessment & Plan:  Severe persistent asthma with acute exacerbation Assessment & Plan: Asthma exacerbation with shortness of breath and wheezing. Pregnancy complicates treatment.  Risks of high-dose steroids discussed. Differential includes allergy or asthma exacerbation. Pulmonary embolism ruled out due to anticoagulation and pregnancy. - Administer steroid injection (Kenalog). - Prescribe low-dose oral prednisone. - Refer urgently to pulmonology. - Prescribe saline nebulizer for symptomatic relief.  Orders: -     Triamcinolone Acetonide -     predniSONE; Take 3 tablets (60 mg total) by mouth daily with breakfast for 3 days, THEN 2 tablets (40 mg total) daily with breakfast for 3 days, THEN 1 tablet (20 mg total) daily with breakfast for 3 days.  Dispense: 18 tablet; Refill: 0 -     Pulmonary Visit -     Cetirizine HCl; Take 1 tablet (10 mg total) by mouth daily.  Dispense: 90 tablet; Refill: 3  Positive urine pregnancy test Assessment & Plan: [redacted] weeks pregnant, complicating asthma management. Discussed risks and benefits of steroid treatment. Shortness of breath impacts daily activities and family care.   History of pulmonary embolus (PE) Assessment & Plan: Low likelihood of pulmonary embolism due to anticoagulation therapy. D-dimer test not pursued due to anticoagulation     Assessment and Plan     Meds ordered this encounter  Medications   triamcinolone acetonide (KENALOG-40) injection 40 mg   predniSONE (DELTASONE) 20 MG tablet    Sig: Take 3 tablets (60 mg total) by mouth daily with breakfast for 3 days, THEN 2 tablets (40 mg total) daily with breakfast for 3 days, THEN 1 tablet (20 mg total) daily with breakfast for 3 days.    Dispense:  18 tablet    Refill:  0   cetirizine (ZYRTEC ALLERGY) 10 MG tablet    Sig: Take 1 tablet (10 mg total) by mouth daily.    Dispense:  90 tablet    Refill:  3    Orders Placed This Encounter  Procedures   Ambulatory referral to Pulmonology     Follow-up: No follow-ups on file.  An After Visit Summary was printed and given to the patient.   I,Lauren M Auman,acting as a Neurosurgeon for [employer], [employer].,have  documented all relevant documentation on the behalf of Odilia Bennett, PA,as directed by  Odilia Bennett, PA while in the presence of Odilia Bennett, [place].    Odilia Bennett, [place] [facility] [phone]

## 2024-01-30 ENCOUNTER — Inpatient Hospital Stay: Payer: MEDICAID | Admitting: Physician Assistant

## 2024-02-01 ENCOUNTER — Encounter (HOSPITAL_COMMUNITY): Payer: Self-pay

## 2024-02-01 ENCOUNTER — Inpatient Hospital Stay (HOSPITAL_COMMUNITY)
Admission: AD | Admit: 2024-02-01 | Discharge: 2024-02-03 | DRG: 831 | Disposition: A | Discharge status: Home / Self Care | Payer: MEDICAID | Attending: Obstetrics and Gynecology | Admitting: Obstetrics and Gynecology

## 2024-02-01 ENCOUNTER — Inpatient Hospital Stay (HOSPITAL_COMMUNITY): Payer: MEDICAID

## 2024-02-01 DIAGNOSIS — O99212 Obesity complicating pregnancy, second trimester: Secondary | ICD-10-CM | POA: Diagnosis present

## 2024-02-01 DIAGNOSIS — J9601 Acute respiratory failure with hypoxia: Secondary | ICD-10-CM | POA: Diagnosis present

## 2024-02-01 DIAGNOSIS — O99512 Diseases of the respiratory system complicating pregnancy, second trimester: Principal | ICD-10-CM | POA: Diagnosis present

## 2024-02-01 DIAGNOSIS — O99012 Anemia complicating pregnancy, second trimester: Secondary | ICD-10-CM | POA: Diagnosis present

## 2024-02-01 DIAGNOSIS — R490 Dysphonia: Secondary | ICD-10-CM | POA: Diagnosis present

## 2024-02-01 DIAGNOSIS — F17210 Nicotine dependence, cigarettes, uncomplicated: Secondary | ICD-10-CM | POA: Diagnosis present

## 2024-02-01 DIAGNOSIS — Z86711 Personal history of pulmonary embolism: Secondary | ICD-10-CM | POA: Diagnosis present

## 2024-02-01 DIAGNOSIS — Z7951 Long term (current) use of inhaled steroids: Secondary | ICD-10-CM

## 2024-02-01 DIAGNOSIS — O98412 Viral hepatitis complicating pregnancy, second trimester: Secondary | ICD-10-CM | POA: Diagnosis present

## 2024-02-01 DIAGNOSIS — O99842 Bariatric surgery status complicating pregnancy, second trimester: Secondary | ICD-10-CM | POA: Diagnosis present

## 2024-02-01 DIAGNOSIS — B1920 Unspecified viral hepatitis C without hepatic coma: Secondary | ICD-10-CM | POA: Diagnosis present

## 2024-02-01 DIAGNOSIS — Z3A20 20 weeks gestation of pregnancy: Secondary | ICD-10-CM

## 2024-02-01 DIAGNOSIS — J45901 Unspecified asthma with (acute) exacerbation: Secondary | ICD-10-CM | POA: Diagnosis present

## 2024-02-01 DIAGNOSIS — O99612 Diseases of the digestive system complicating pregnancy, second trimester: Secondary | ICD-10-CM | POA: Diagnosis present

## 2024-02-01 DIAGNOSIS — Z8249 Family history of ischemic heart disease and other diseases of the circulatory system: Secondary | ICD-10-CM

## 2024-02-01 DIAGNOSIS — J45909 Unspecified asthma, uncomplicated: Secondary | ICD-10-CM | POA: Diagnosis present

## 2024-02-01 DIAGNOSIS — Z91040 Latex allergy status: Secondary | ICD-10-CM

## 2024-02-01 DIAGNOSIS — K219 Gastro-esophageal reflux disease without esophagitis: Secondary | ICD-10-CM | POA: Diagnosis present

## 2024-02-01 DIAGNOSIS — J189 Pneumonia, unspecified organism: Principal | ICD-10-CM | POA: Diagnosis present

## 2024-02-01 DIAGNOSIS — O99332 Smoking (tobacco) complicating pregnancy, second trimester: Secondary | ICD-10-CM | POA: Diagnosis present

## 2024-02-01 DIAGNOSIS — B182 Chronic viral hepatitis C: Secondary | ICD-10-CM | POA: Diagnosis present

## 2024-02-01 DIAGNOSIS — Z1152 Encounter for screening for COVID-19: Secondary | ICD-10-CM

## 2024-02-01 LAB — COMPREHENSIVE METABOLIC PANEL WITH GFR
ALT: 14 U/L (ref 0–44)
AST: 22 U/L (ref 15–41)
Albumin: 2.7 g/dL — ABNORMAL LOW (ref 3.5–5.0)
Alkaline Phosphatase: 51 U/L (ref 38–126)
Anion gap: 12 (ref 5–15)
BUN: 9 mg/dL (ref 6–20)
CO2: 22 mmol/L (ref 22–32)
Calcium: 9.4 mg/dL (ref 8.9–10.3)
Chloride: 104 mmol/L (ref 98–111)
Creatinine, Ser: 0.68 mg/dL (ref 0.44–1.00)
GFR, Estimated: >60 mL/min (ref >60)
Glucose, Bld: 151 mg/dL — ABNORMAL HIGH (ref 70–99)
Potassium: 4 mmol/L (ref 3.5–5.1)
Sodium: 138 mmol/L (ref 135–145)
Total Bilirubin: 0.3 mg/dL (ref 0.0–1.2)
Total Protein: 6 g/dL — ABNORMAL LOW (ref 6.5–8.1)

## 2024-02-01 LAB — CBC
HCT: 28.2 % — ABNORMAL LOW (ref 36.0–46.0)
Hemoglobin: 9.1 g/dL — ABNORMAL LOW (ref 12.0–15.0)
MCH: 29.4 pg (ref 26.0–34.0)
MCHC: 32.3 g/dL (ref 30.0–36.0)
MCV: 91 fL (ref 80.0–100.0)
Platelets: 367 10*3/uL (ref 150–400)
RBC: 3.1 MIL/uL — ABNORMAL LOW (ref 3.87–5.11)
RDW: 16.3 % — ABNORMAL HIGH (ref 11.5–15.5)
WBC: 16.7 10*3/uL — ABNORMAL HIGH (ref 4.0–10.5)
nRBC: 0 % (ref 0.0–0.2)

## 2024-02-01 LAB — RESP PANEL BY RT-PCR (RSV, FLU A&B, COVID)  RVPGX2
Influenza A by PCR: NEGATIVE
Influenza B by PCR: NEGATIVE
Resp Syncytial Virus by PCR: NEGATIVE
SARS Coronavirus 2 by RT PCR: NEGATIVE

## 2024-02-01 LAB — POCT PREGNANCY, URINE: Preg Test, Ur: POSITIVE — AB

## 2024-02-01 MED ORDER — IPRATROPIUM-ALBUTEROL 0.5-2.5 (3) MG/3ML IN SOLN
3.0000 mL | Freq: Once | RESPIRATORY_TRACT | Status: AC
  Administered 2024-02-01: 3 mL via RESPIRATORY_TRACT
  Filled 2024-02-01: qty 6

## 2024-02-01 MED ORDER — METHYLPREDNISOLONE SODIUM SUCC 125 MG IJ SOLR
125.0000 mg | Freq: Once | INTRAMUSCULAR | Status: AC
  Administered 2024-02-01: 125 mg via INTRAVENOUS
  Filled 2024-02-01: qty 2

## 2024-02-01 MED ORDER — IOHEXOL 350 MG/ML SOLN
75.0000 mL | Freq: Once | INTRAVENOUS | Status: AC | PRN
  Administered 2024-02-01: 75 mL via INTRAVENOUS

## 2024-02-01 NOTE — MAU Provider Note (Cosign Needed Addendum)
 History     CSN: [id number]  Arrival date and time: 02/01/24 1835   Event Date/Time   First Provider Initiated Contact with Patient 02/01/24 1901      Chief Complaint  Patient presents with   Shortness of Breath    Wheezing     Shortness of Breath  Associated symptoms include shortness of breath and wheezing. Pertinent negatives include no fever.    Ana Reyes is a 38 y.o. [id number] at [redacted] weeks gestation who presents for evaluation of shortness of breath. Patient has been seen multiple times for asthma exacerbation over the past several weeks. She has both long & short acting corticosteroids with minimal relief. Has tried multiple courses of steroid tapers and antihistamines. Still reports worsening shortness of breath daily.    She denies any vaginal bleeding, discharge, and leaking of fluid. Denies any constipation, diarrhea or any urinary complaints.    OB History     Gravida  5   Para  2   Term  2   Preterm      AB  2   Living  2      SAB  2   IAB      Ectopic      Multiple  0   Live Births  2           Past Medical History:  Diagnosis Date   Asthma    Cervical dysplasia    Endometriosis    Hx of hepatitis C    Hx pulmonary embolism    Kidney stones     Past Surgical History:  Procedure Laterality Date   CHOLECYSTECTOMY     GASTRIC BYPASS     LAPAROSCOPY      Family History  Problem Relation Age of Onset   Anxiety disorder Mother    Anxiety disorder Father    ADD / ADHD Father    Bipolar disorder Sister    ADD / ADHD Brother    Anxiety disorder Brother    Heart attack Paternal Grandmother     Social History   Tobacco Use   Smoking status: Every Day    Current packs/day: 1.00    Types: Cigarettes   Smokeless tobacco: Never  Substance Use Topics   Alcohol use: No   Drug use: No    Allergies:  Allergies  Allergen Reactions   Latex Other (See Comments)    Causes redness.   Morphine  And Codeine Nausea Only    Toradol [Ketorolac Tromethamine] Rash    Tolerates ibuprofen    Tramadol Nausea And Vomiting and Rash    Medications Prior to Admission  Medication Sig Dispense Refill Last Dose/Taking   albuterol  (PROVENTIL ) (5 MG/ML) 0.5% nebulizer solution Take 0.5 mLs (2.5 mg total) by nebulization every 6 (six) hours as needed for wheezing or shortness of breath. 20 mL 2    albuterol  (VENTOLIN  HFA) 108 (90 Base) MCG/ACT inhaler Inhale 2 puffs into the lungs every 6 (six) hours as needed for wheezing or shortness of breath. 6.7 g 2    calcium carbonate (TUMS - DOSED IN MG ELEMENTAL CALCIUM) 500 MG chewable tablet Chew 2 tablets by mouth 3 (three) times daily as needed for indigestion or heartburn.      cetirizine  (ZYRTEC  ALLERGY) 10 MG tablet Take 1 tablet (10 mg total) by mouth daily. 90 tablet 3    diphenhydrAMINE  (BENADRYL ) 25 mg capsule Take 25 mg by mouth at bedtime as needed.      enoxaparin  (LOVENOX )  40 MG/0.4ML injection 40 MG injection once daily. 12 mL 2    fluticasone (FLOVENT HFA) 220 MCG/ACT inhaler Inhale 2 puffs into the lungs in the morning and at bedtime.      folic acid (FOLVITE) 1 MG tablet Take 1 tablet by mouth daily.      omeprazole  (PRILOSEC) 40 MG capsule TAKE 1 CAPSULE(40 MG) BY MOUTH DAILY 90 capsule 0    predniSONE  (DELTASONE ) 20 MG tablet Take 3 tablets (60 mg total) by mouth daily with breakfast for 3 days, THEN 2 tablets (40 mg total) daily with breakfast for 3 days, THEN 1 tablet (20 mg total) daily with breakfast for 3 days. 18 tablet 0    Prenat MV-Min w/Fe-Folate-DHA (PRENATAL COMPLETE) CPPK 1 po qd 30 each 11     Review of Systems  Constitutional:  Negative for fatigue and fever.  Respiratory:  Positive for chest tightness, shortness of breath and wheezing.   Gastrointestinal:  Negative for abdominal pain, diarrhea and nausea.  Genitourinary:  Negative for vaginal bleeding.   Physical Exam   Last menstrual period 09/09/2023, unknown if currently breastfeeding.  No  data found.  Physical Exam Vitals and nursing note reviewed.  Cardiovascular:     Rate and Rhythm: Normal rate and regular rhythm.  Pulmonary:     Effort: Tachypnea and accessory muscle usage present.     Breath sounds: Examination of the right-upper field reveals wheezing. Examination of the left-upper field reveals wheezing. Examination of the right-middle field reveals wheezing. Examination of the left-middle field reveals wheezing. Examination of the right-lower field reveals wheezing. Examination of the left-lower field reveals wheezing. Wheezing present.  Neurological:     Mental Status: She is alert.  Psychiatric:        Mood and Affect: Mood is anxious.         MAU Course  Procedures  Results for orders placed or performed during the hospital encounter of 02/01/24 (from the past 24 hours)  Pregnancy, urine POC     Status: Abnormal   Collection Time: 02/01/24  6:59 PM  Result Value Ref Range   Preg Test, Ur POSITIVE (A) NEGATIVE  CBC     Status: Abnormal   Collection Time: 02/01/24  8:01 PM  Result Value Ref Range   WBC 16.7 (H) 4.0 - 10.5 K/uL   RBC 3.10 (L) 3.87 - 5.11 MIL/uL   Hemoglobin 9.1 (L) 12.0 - 15.0 g/dL   HCT 16.1 (L) 09.6 - 04.5 %   MCV 91.0 80.0 - 100.0 fL   MCH 29.4 26.0 - 34.0 pg   MCHC 32.3 30.0 - 36.0 g/dL   RDW 40.9 (H) 81.1 - 91.4 %   Platelets 367 150 - 400 K/uL   nRBC 0.0 0.0 - 0.2 %  Comprehensive metabolic panel     Status: Abnormal   Collection Time: 02/01/24  8:01 PM  Result Value Ref Range   Sodium 138 135 - 145 mmol/L   Potassium 4.0 3.5 - 5.1 mmol/L   Chloride 104 98 - 111 mmol/L   CO2 22 22 - 32 mmol/L   Glucose, Bld 151 (H) 70 - 99 mg/dL   BUN 9 6 - 20 mg/dL   Creatinine, Ser 7.82 0.44 - 1.00 mg/dL   Calcium 9.4 8.9 - 95.6 mg/dL   Total Protein 6.0 (L) 6.5 - 8.1 g/dL   Albumin 2.7 (L) 3.5 - 5.0 g/dL   AST 22 15 - 41 U/L   ALT 14 0 - 44 U/L  Alkaline Phosphatase 51 38 - 126 U/L   Total Bilirubin 0.3 0.0 - 1.2 mg/dL   GFR,  Estimated >40 >98 mL/min   Anion gap 12 5 - 15     No results found.   MDM Prenatal records from community office reviewed. Pregnancy complicated by asthma, history of PE, history of gastric bypass, history of postpartum eclampsia. Labs ordered and reviewed.   Called to bedside by RN due to patient presentation.  IV placed CBC, CMP Solu-medrol   Duoneb EKG  Due to patient history of multiple PE's, discussed with patient importance of ruling out due to history and shortness of breath. Patient taking Lovenox  daily, last dose was this morning- has not missed doses. Agreeable to CT today.  CT angio PE  WBC elevated on CBC. Patient denies respiratory symptoms or sick contacts. Will do chest x-ray & swab for covid/flu  Covid, Flu PCR Chest Xray  RN notified provider of decreased O2 despite IV steroids and breathing treatment. O2 by nasal cannula applied. Upon further assessment, patient reports driving to hospital on family member's oxygen for support. Decreased in O2 likely return to baseline & O2 support resumed.  Report given to Loetta Ringer, CNM at [address]  [facility] 02/01/2024, 7:15 PM    Assessment and Plan   Reassessment (11:39 PM) -CT returns positive for pneumonia and mild pleural effusion. -Patient remains on 2L of O2 -Dr. Racheal Buddle notified of patient status and need for continued O2 therapy. -Advises admit to ante and he will contact medicine for mgmt.  -Nursing team updated.   Kraig Peru MSN, CNM Advanced Practice Provider, Center for [facility]  Addendum 12:42 AM -Standard Antepartum orders placed. -Lovenox  ordered per previous dosing. -Okay for saline lock.  Kraig Peru MSN, CNM Advanced Practice Provider, Center for [employer]

## 2024-02-01 NOTE — MAU Note (Signed)
.  Ana Reyes is a 38 y.o. at Unknown here in MAU reporting: shortness of breath which worsened today around 1100 but has been an ongoing concern for about a month and half ago. Has been seen in different urgent cares and emergency departments.   Also reports chest pain. Has asthma, uses inhaler regularly, hasn't been helping. Denies being around any who is sick.   LMP: December, 2025 ( pt reports she is 19weeks, +UPC, gets care in [place])  Onset of complaint: see above Pain score: 5/10 Vitals:   02/01/24 1915  BP: 124/72  Pulse: 95  Temp: 98 F (36.7 C)  SpO2: 95%     FHT:150 Lab orders placed from triage:  UA

## 2024-02-02 ENCOUNTER — Other Ambulatory Visit: Payer: Self-pay

## 2024-02-02 DIAGNOSIS — O99512 Diseases of the respiratory system complicating pregnancy, second trimester: Principal | ICD-10-CM

## 2024-02-02 DIAGNOSIS — R0602 Shortness of breath: Secondary | ICD-10-CM | POA: Diagnosis present

## 2024-02-02 DIAGNOSIS — J189 Pneumonia, unspecified organism: Secondary | ICD-10-CM | POA: Diagnosis present

## 2024-02-02 DIAGNOSIS — Z91040 Latex allergy status: Secondary | ICD-10-CM | POA: Diagnosis not present

## 2024-02-02 DIAGNOSIS — O98412 Viral hepatitis complicating pregnancy, second trimester: Secondary | ICD-10-CM | POA: Diagnosis present

## 2024-02-02 DIAGNOSIS — Z3A00 Weeks of gestation of pregnancy not specified: Secondary | ICD-10-CM

## 2024-02-02 DIAGNOSIS — Z3A20 20 weeks gestation of pregnancy: Secondary | ICD-10-CM | POA: Diagnosis not present

## 2024-02-02 DIAGNOSIS — Z1152 Encounter for screening for COVID-19: Secondary | ICD-10-CM | POA: Diagnosis not present

## 2024-02-02 DIAGNOSIS — F17210 Nicotine dependence, cigarettes, uncomplicated: Secondary | ICD-10-CM | POA: Diagnosis present

## 2024-02-02 DIAGNOSIS — Z7951 Long term (current) use of inhaled steroids: Secondary | ICD-10-CM | POA: Diagnosis not present

## 2024-02-02 DIAGNOSIS — B182 Chronic viral hepatitis C: Secondary | ICD-10-CM | POA: Diagnosis present

## 2024-02-02 DIAGNOSIS — O99012 Anemia complicating pregnancy, second trimester: Secondary | ICD-10-CM | POA: Diagnosis present

## 2024-02-02 DIAGNOSIS — K219 Gastro-esophageal reflux disease without esophagitis: Secondary | ICD-10-CM | POA: Diagnosis present

## 2024-02-02 DIAGNOSIS — O99332 Smoking (tobacco) complicating pregnancy, second trimester: Secondary | ICD-10-CM | POA: Diagnosis present

## 2024-02-02 DIAGNOSIS — Z8249 Family history of ischemic heart disease and other diseases of the circulatory system: Secondary | ICD-10-CM | POA: Diagnosis not present

## 2024-02-02 DIAGNOSIS — O99212 Obesity complicating pregnancy, second trimester: Secondary | ICD-10-CM | POA: Diagnosis present

## 2024-02-02 DIAGNOSIS — R490 Dysphonia: Secondary | ICD-10-CM | POA: Diagnosis present

## 2024-02-02 DIAGNOSIS — O99612 Diseases of the digestive system complicating pregnancy, second trimester: Secondary | ICD-10-CM | POA: Diagnosis present

## 2024-02-02 DIAGNOSIS — J45901 Unspecified asthma with (acute) exacerbation: Secondary | ICD-10-CM | POA: Diagnosis present

## 2024-02-02 DIAGNOSIS — Z86711 Personal history of pulmonary embolism: Secondary | ICD-10-CM | POA: Diagnosis not present

## 2024-02-02 DIAGNOSIS — O99842 Bariatric surgery status complicating pregnancy, second trimester: Secondary | ICD-10-CM | POA: Diagnosis present

## 2024-02-02 DIAGNOSIS — J9601 Acute respiratory failure with hypoxia: Secondary | ICD-10-CM | POA: Diagnosis present

## 2024-02-02 LAB — RETICULOCYTES
Immature Retic Fract: 29.9 % — ABNORMAL HIGH (ref 2.3–15.9)
RBC.: 3.17 MIL/uL — ABNORMAL LOW (ref 3.87–5.11)
Retic Count, Absolute: 57.4 10*3/uL (ref 19.0–186.0)
Retic Ct Pct: 1.8 % (ref 0.4–3.1)

## 2024-02-02 LAB — TYPE AND SCREEN
ABO/RH(D): O POS
Antibody Screen: NEGATIVE

## 2024-02-02 LAB — CBC
HCT: 27.8 % — ABNORMAL LOW (ref 36.0–46.0)
Hemoglobin: 8.9 g/dL — ABNORMAL LOW (ref 12.0–15.0)
MCH: 29.1 pg (ref 26.0–34.0)
MCHC: 32 g/dL (ref 30.0–36.0)
MCV: 90.8 fL (ref 80.0–100.0)
Platelets: 333 10*3/uL (ref 150–400)
RBC: 3.06 MIL/uL — ABNORMAL LOW (ref 3.87–5.11)
RDW: 16.5 % — ABNORMAL HIGH (ref 11.5–15.5)
WBC: 15.5 10*3/uL — ABNORMAL HIGH (ref 4.0–10.5)
nRBC: 0 % (ref 0.0–0.2)

## 2024-02-02 LAB — RESPIRATORY PANEL BY PCR

## 2024-02-02 LAB — VITAMIN B12: Vitamin B-12: 252 pg/mL (ref 180–914)

## 2024-02-02 LAB — EXPECTORATED SPUTUM ASSESSMENT W GRAM STAIN, RFLX TO RESP C

## 2024-02-02 LAB — FERRITIN: Ferritin: 11 ng/mL (ref 11–307)

## 2024-02-02 LAB — IRON AND TIBC
Iron: 25 ug/dL — ABNORMAL LOW (ref 28–170)
Saturation Ratios: 6 % — ABNORMAL LOW (ref 10.4–31.8)
TIBC: 437 ug/dL (ref 250–450)
UIBC: 412 ug/dL

## 2024-02-02 LAB — STREP PNEUMONIAE URINARY ANTIGEN: Strep Pneumo Urinary Antigen: NEGATIVE

## 2024-02-02 LAB — TROPONIN I (HIGH SENSITIVITY)
Troponin I (High Sensitivity): 5 ng/L (ref <18)
Troponin I (High Sensitivity): 11 ng/L (ref <18)

## 2024-02-02 LAB — FOLATE: Folate: 21.8 ng/mL (ref >5.9)

## 2024-02-02 LAB — GROUP A STREP BY PCR: Group A Strep by PCR: NOT DETECTED

## 2024-02-02 MED ORDER — CALCIUM CARBONATE ANTACID 500 MG PO CHEW
2.0000 | CHEWABLE_TABLET | ORAL | Status: DC | PRN
  Administered 2024-02-03: 400 mg via ORAL
  Filled 2024-02-02: qty 2

## 2024-02-02 MED ORDER — SODIUM CHLORIDE 0.9 % IV SOLN
2.0000 g | INTRAVENOUS | Status: DC
  Administered 2024-02-02: 2 g via INTRAVENOUS
  Filled 2024-02-02: qty 20

## 2024-02-02 MED ORDER — IPRATROPIUM-ALBUTEROL 0.5-2.5 (3) MG/3ML IN SOLN
3.0000 mL | Freq: Three times a day (TID) | RESPIRATORY_TRACT | Status: DC
  Administered 2024-02-03: 3 mL via RESPIRATORY_TRACT
  Filled 2024-02-02 (×3): qty 3

## 2024-02-02 MED ORDER — GUAIFENESIN-DM 100-10 MG/5ML PO SYRP: 5.0000 mL | ORAL_SOLUTION | ORAL | Status: DC | PRN

## 2024-02-02 MED ORDER — OXYCODONE HCL 5 MG PO TABS
5.0000 mg | ORAL_TABLET | ORAL | Status: DC | PRN
  Administered 2024-02-02: 5 mg via ORAL
  Filled 2024-02-02: qty 1

## 2024-02-02 MED ORDER — IPRATROPIUM-ALBUTEROL 0.5-2.5 (3) MG/3ML IN SOLN
3.0000 mL | Freq: Four times a day (QID) | RESPIRATORY_TRACT | Status: DC
  Administered 2024-02-02 (×3): 3 mL via RESPIRATORY_TRACT
  Filled 2024-02-02 (×5): qty 3

## 2024-02-02 MED ORDER — SODIUM CHLORIDE 0.9 % IV SOLN
1.0000 g | INTRAVENOUS | Status: DC
  Administered 2024-02-02: 1 g via INTRAVENOUS
  Filled 2024-02-02: qty 10

## 2024-02-02 MED ORDER — DOCUSATE SODIUM 100 MG PO CAPS
100.0000 mg | ORAL_CAPSULE | Freq: Every day | ORAL | Status: DC
  Administered 2024-02-02 – 2024-02-03 (×2): 100 mg via ORAL
  Filled 2024-02-02 (×2): qty 1

## 2024-02-02 MED ORDER — BUDESONIDE 0.25 MG/2ML IN SUSP
0.2500 mg | Freq: Two times a day (BID) | RESPIRATORY_TRACT | Status: DC
Start: 2024-02-02 — End: 2024-02-02
  Filled 2024-02-02: qty 2

## 2024-02-02 MED ORDER — BUDESONIDE 0.5 MG/2ML IN SUSP
1.0000 mg | Freq: Two times a day (BID) | RESPIRATORY_TRACT | Status: DC
  Administered 2024-02-02 – 2024-02-03 (×3): 1 mg via RESPIRATORY_TRACT
  Filled 2024-02-02 (×4): qty 4

## 2024-02-02 MED ORDER — ACETAMINOPHEN 325 MG PO TABS
650.0000 mg | ORAL_TABLET | ORAL | Status: DC | PRN
  Administered 2024-02-02: 650 mg via ORAL
  Filled 2024-02-02: qty 2

## 2024-02-02 MED ORDER — PRENATAL MULTIVITAMIN CH
1.0000 | ORAL_TABLET | Freq: Every day | ORAL | Status: DC
  Administered 2024-02-02: 1 via ORAL
  Filled 2024-02-02: qty 1

## 2024-02-02 MED ORDER — FOLIC ACID 1 MG PO TABS
1.0000 mg | ORAL_TABLET | Freq: Every day | ORAL | Status: DC
  Administered 2024-02-02 – 2024-02-03 (×2): 1 mg via ORAL
  Filled 2024-02-02 (×2): qty 1

## 2024-02-02 MED ORDER — NICOTINE 21 MG/24HR TD PT24
21.0000 mg | MEDICATED_PATCH | Freq: Every day | TRANSDERMAL | Status: DC
  Administered 2024-02-02 – 2024-02-03 (×2): 21 mg via TRANSDERMAL
  Filled 2024-02-02 (×2): qty 1

## 2024-02-02 MED ORDER — ALBUTEROL SULFATE (2.5 MG/3ML) 0.083% IN NEBU
2.5000 mg | INHALATION_SOLUTION | RESPIRATORY_TRACT | Status: DC | PRN
  Administered 2024-02-03: 2.5 mg via RESPIRATORY_TRACT
  Filled 2024-02-02: qty 3

## 2024-02-02 MED ORDER — SODIUM CHLORIDE 0.9 % IV SOLN
500.0000 mg | INTRAVENOUS | Status: DC
  Administered 2024-02-02 – 2024-02-03 (×2): 500 mg via INTRAVENOUS
  Filled 2024-02-02 (×3): qty 5

## 2024-02-02 MED ORDER — METHYLPREDNISOLONE SODIUM SUCC 125 MG IJ SOLR
60.0000 mg | Freq: Two times a day (BID) | INTRAMUSCULAR | Status: DC
  Administered 2024-02-02 – 2024-02-03 (×3): 60 mg via INTRAVENOUS
  Filled 2024-02-02 (×3): qty 2

## 2024-02-02 MED ORDER — SODIUM CHLORIDE 0.9 % IV SOLN: INTRAVENOUS | Status: AC

## 2024-02-02 MED ORDER — PANTOPRAZOLE SODIUM 40 MG PO TBEC
40.0000 mg | DELAYED_RELEASE_TABLET | Freq: Every day | ORAL | Status: DC
  Administered 2024-02-02 – 2024-02-03 (×2): 40 mg via ORAL
  Filled 2024-02-02 (×2): qty 1

## 2024-02-02 MED ORDER — ENOXAPARIN SODIUM 60 MG/0.6ML IJ SOSY
50.0000 mg | PREFILLED_SYRINGE | INTRAMUSCULAR | Status: DC
  Administered 2024-02-02 – 2024-02-03 (×2): 50 mg via SUBCUTANEOUS
  Filled 2024-02-02 (×2): qty 0.6
  Filled 2024-02-02: qty 0.8

## 2024-02-02 MED ORDER — POLYETHYLENE GLYCOL 3350 17 G PO PACK
17.0000 g | PACK | Freq: Every day | ORAL | Status: DC
  Administered 2024-02-02 – 2024-02-03 (×2): 17 g via ORAL
  Filled 2024-02-02 (×2): qty 1

## 2024-02-02 NOTE — Progress Notes (Signed)
 Patient ID: Ana Reyes, female   DOB: [birth year], 38 y.o.   MRN: [id number]   Assessment/Plan: Principal Problem:   Pneumonia affecting pregnancy in second trimester 20 weeks of pregnancy  Hypoxic respiratory failure multifactorial, likely related to underlying lung disease with asthma as well as multifocal pneumonia O2 as needed  Pneumonia Rocephin plus azithromycin Respiratory panel is negative Sputum shows gram-positive cocci in pairs and chains consistent with a form of strep (urinary strep pneumo is negative) Check group A strep Should be on appropriate antibiotics  Asthma exacerbation Steroids, as previously been on p.o. without much success.  Currently on IV Solu-Medrol 60 mg twice daily Inhaled steroids are continued Atrovent and albuterol  nebs as needed  20 weeks pregnancy Doppler every shift  Anemia Hemoglobin is 8.9 Check anemia panel and replete as needed  Hepatitis C Viral load is greater than 100,000 will refer for treatment post daily  History of pulmonary embolism Negative CT angiogram on admission On Lovenox  prophylaxis during pregnancy  Tobacco use Smokes approximately 1 pack/day On nicotine  patch  GERD Continue PPI  Subjective: Interval History: Feels like her breathing is much better.  She remains quite hoarse  Objective: Vital signs in last 24 hours: Temp:  [97.8 F (36.6 C)-98.4 F (36.9 C)] 98.4 F (36.9 C) (05/25 0808) Pulse Rate:  [70-101] 70 (05/25 0808) Resp:  [20-24] 22 (05/25 0808) BP: (108-124)/(59-72) 115/59 (05/25 0808) SpO2:  [92 %-99 %] 95 % (05/25 1042)  Intake/Output from previous day: 05/24 0701 - 05/25 0700 In: 132.6  Out: 200 [Urine:200] Intake/Output this shift: No intake/output data recorded.  General appearance: alert, cooperative, appears stated age, moderately obese, and ill-appearing Head: Normocephalic, without obvious abnormality, atraumatic Lungs: Tachypneic Heart: regular rate and rhythm Abdomen:  soft, non-tender; bowel sounds normal; no masses,  no organomegaly  Results for orders placed or performed during the hospital encounter of 02/01/24 (from the past 24 hours)  Pregnancy, urine POC     Status: Abnormal   Collection Time: 02/01/24  6:59 PM  Result Value Ref Range   Preg Test, Ur POSITIVE (A) NEGATIVE  CBC     Status: Abnormal   Collection Time: 02/01/24  8:01 PM  Result Value Ref Range   WBC 16.7 (H) 4.0 - 10.5 K/uL   RBC 3.10 (L) 3.87 - 5.11 MIL/uL   Hemoglobin 9.1 (L) 12.0 - 15.0 g/dL   HCT 95.6 (L) 21.3 - 08.6 %   MCV 91.0 80.0 - 100.0 fL   MCH 29.4 26.0 - 34.0 pg   MCHC 32.3 30.0 - 36.0 g/dL   RDW 57.8 (H) 46.9 - 62.9 %   Platelets 367 150 - 400 K/uL   nRBC 0.0 0.0 - 0.2 %  Comprehensive metabolic panel     Status: Abnormal   Collection Time: 02/01/24  8:01 PM  Result Value Ref Range   Sodium 138 135 - 145 mmol/L   Potassium 4.0 3.5 - 5.1 mmol/L   Chloride 104 98 - 111 mmol/L   CO2 22 22 - 32 mmol/L   Glucose, Bld 151 (H) 70 - 99 mg/dL   BUN 9 6 - 20 mg/dL   Creatinine, Ser 5.28 0.44 - 1.00 mg/dL   Calcium 9.4 8.9 - 41.3 mg/dL   Total Protein 6.0 (L) 6.5 - 8.1 g/dL   Albumin 2.7 (L) 3.5 - 5.0 g/dL   AST 22 15 - 41 U/L   ALT 14 0 - 44 U/L   Alkaline Phosphatase 51 38 - 126 U/L  Total Bilirubin 0.3 0.0 - 1.2 mg/dL   GFR, Estimated >45 >40 mL/min   Anion gap 12 5 - 15  Resp panel by RT-PCR (RSV, Flu A&B, Covid) Anterior Nasal Swab     Status: None   Collection Time: 02/01/24  8:20 PM   Specimen: Anterior Nasal Swab  Result Value Ref Range   SARS Coronavirus 2 by RT PCR NEGATIVE NEGATIVE   Influenza A by PCR NEGATIVE NEGATIVE   Influenza B by PCR NEGATIVE NEGATIVE   Resp Syncytial Virus by PCR NEGATIVE NEGATIVE  Type and screen [facility]     Status: None   Collection Time: 02/02/24  2:57 AM  Result Value Ref Range   ABO/RH(D) O POS    Antibody Screen NEG    Sample Expiration      02/05/2024,2359 Performed at [facility]  Lab, [address], [place], [place] [address]   Troponin I (High Sensitivity)     Status: None   Collection Time: 02/02/24  4:08 AM  Result Value Ref Range   Troponin I (High Sensitivity) 5 <18 ng/L  CBC     Status: Abnormal   Collection Time: 02/02/24  4:08 AM  Result Value Ref Range   WBC 15.5 (H) 4.0 - 10.5 K/uL   RBC 3.06 (L) 3.87 - 5.11 MIL/uL   Hemoglobin 8.9 (L) 12.0 - 15.0 g/dL   HCT 14.7 (L) 82.9 - 56.2 %   MCV 90.8 80.0 - 100.0 fL   MCH 29.1 26.0 - 34.0 pg   MCHC 32.0 30.0 - 36.0 g/dL   RDW 13.0 (H) 86.5 - 78.4 %   Platelets 333 150 - 400 K/uL   nRBC 0.0 0.0 - 0.2 %  Expectorated Sputum Assessment w Gram Stain, Rflx to Resp Cult     Status: None   Collection Time: 02/02/24  4:28 AM   Specimen: Sputum  Result Value Ref Range   Specimen Description SPUTUM    Special Requests NONE    Sputum evaluation      THIS SPECIMEN IS ACCEPTABLE FOR SPUTUM CULTURE Performed at [facility], [address], [place], [place] [address]    Report Status 02/02/2024 FINAL   Strep pneumoniae urinary antigen     Status: None   Collection Time: 02/02/24  4:28 AM  Result Value Ref Range   Strep Pneumo Urinary Antigen NEGATIVE NEGATIVE  Respiratory (~20 pathogens) panel by PCR     Status: None   Collection Time: 02/02/24  4:28 AM   Specimen: Expectorated Sputum; Respiratory  Result Value Ref Range   Adenovirus NOT DETECTED NOT DETECTED   Coronavirus 229E NOT DETECTED NOT DETECTED   Coronavirus HKU1 NOT DETECTED NOT DETECTED   Coronavirus NL63 NOT DETECTED NOT DETECTED   Coronavirus OC43 NOT DETECTED NOT DETECTED   Metapneumovirus NOT DETECTED NOT DETECTED   Rhinovirus / Enterovirus NOT DETECTED NOT DETECTED   Influenza A NOT DETECTED NOT DETECTED   Influenza B NOT DETECTED NOT DETECTED   Parainfluenza Virus 1 NOT DETECTED NOT DETECTED   Parainfluenza Virus 2 NOT DETECTED NOT DETECTED   Parainfluenza Virus 3 NOT DETECTED NOT DETECTED   Parainfluenza Virus 4 NOT DETECTED NOT  DETECTED   Respiratory Syncytial Virus NOT DETECTED NOT DETECTED   Bordetella pertussis NOT DETECTED NOT DETECTED   Bordetella Parapertussis NOT DETECTED NOT DETECTED   Chlamydophila pneumoniae NOT DETECTED NOT DETECTED   Mycoplasma pneumoniae NOT DETECTED NOT DETECTED  Culture, Respiratory w Gram Stain  Status: None (Preliminary result)   Collection Time: 02/02/24  4:28 AM   Specimen: SPU  Result Value Ref Range   Specimen Description SPUTUM    Special Requests NONE Reflexed from [id number]    Gram Stain      FEW SQUAMOUS EPITHELIAL CELLS PRESENT FEW WBC PRESENT,BOTH PMN AND MONONUCLEAR FEW GRAM POSITIVE COCCI IN PAIRS AND CHAINS Performed at [facility], [address] [place], [place] [address]    Culture PENDING    Report Status PENDING   Troponin I (High Sensitivity)     Status: None   Collection Time: 02/02/24  5:40 AM  Result Value Ref Range   Troponin I (High Sensitivity) 11 <18 ng/L    Studies/Results: CT Angio Chest PE W and/or Wo Contrast Result Date: 02/01/2024 CLINICAL DATA:  Pulmonary embolism (PE) suspected, high prob. Short of breath EXAM: CT ANGIOGRAPHY CHEST WITH CONTRAST TECHNIQUE: Multidetector CT imaging of the chest was performed using the standard protocol during bolus administration of intravenous contrast. Multiplanar CT image reconstructions and MIPs were obtained to evaluate the vascular anatomy. RADIATION DOSE REDUCTION: This exam was performed according to the departmental dose-optimization program which includes automated exposure control, adjustment of the mA and/or kV according to patient size and/or use of iterative reconstruction technique. CONTRAST:  75mL OMNIPAQUE IOHEXOL 350 MG/ML SOLN COMPARISON:  Chest x-ray 02/01/2024 FINDINGS: Cardiovascular: Satisfactory opacification of the pulmonary arteries to the segmental level. No evidence of pulmonary embolism. Normal heart size. No significant pericardial effusion. The thoracic aorta is normal in  caliber. No atherosclerotic plaque of the thoracic aorta. No coronary artery calcifications. Mediastinum/Nodes: Prominent but not enlarged left hilar and mediastinal lymph nodes. No enlarged mediastinal, hilar, or axillary lymph nodes. Thyroid gland, trachea, and esophagus demonstrate no significant findings. Lungs/Pleura: Right base atelectasis. Diffuse patchy ground-glass consolidative airspace opacities, left greater than right. No pulmonary nodule. No pulmonary mass. Trace right pleural effusion. No pneumothorax. 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 Status post cholecystectomy Musculoskeletal: No chest wall abnormality. No suspicious lytic or blastic osseous lesions. No acute displaced fracture. Review of the MIP images confirms the above findings. IMPRESSION: 1. No pulmonary embolus. 2. Multifocal pneumonia. 3. Trace right pleural effusion. Electronically Signed   By: Morgane  Naveau M.D.   On: 02/01/2024 21:40   DG Chest 2 View Result Date: 02/01/2024 CLINICAL DATA:  shortness of breath EXAM: CHEST - 2 VIEW COMPARISON:  CT angio chest 02/01/2024 FINDINGS: The heart and mediastinal contours are within normal limits. Diffuse patchy airspace opacities. No pulmonary edema. Trace bilateral pleural effusions. No pneumothorax. No acute osseous abnormality. IMPRESSION: Multifocal pneumonia with trace bilateral pleural effusions. Followup PA and lateral chest X-ray is recommended in 3-4 weeks following therapy to ensure resolution. Electronically Signed   By: Morgane  Naveau M.D.   On: 02/01/2024 21:39    Scheduled Meds:  budesonide  1 mg Nebulization BID   docusate sodium   100 mg Oral Daily   enoxaparin   50 mg Subcutaneous Q24H   folic acid  1 mg Oral Daily   ipratropium-albuterol   3 mL Nebulization Q6H   methylPREDNISolone sodium succinate  60 mg Intravenous Q12H   nicotine   21 mg Transdermal Daily   pantoprazole   40 mg Oral Daily   prenatal multivitamin  1  tablet Oral Q1200   Continuous Infusions:  sodium chloride  75 mL/hr at 02/02/24 0952   azithromycin 500 mg (02/02/24 0624)   cefTRIAXone (ROCEPHIN)  IV     PRN Meds:acetaminophen , albuterol ,  calcium carbonate, guaiFENesin-dextromethorphan, oxyCODONE     LOS: 0 days   Granville Layer, MD 02/02/2024 11:26 AM

## 2024-02-02 NOTE — Progress Notes (Signed)
 Briefly, patient is a 38 year old G5 P2-0-2-2 female at [redacted] weeks gestation, has history of PE, postpartum eclampsia, chronic hepatitis C and ongoing tobacco user who was admitted earlier this morning for multifocal pneumonia and likely COPD flare. COVID influenza and RSV are negative, CTA ruled out PE.  Patient has been treated with Solu-Medrol, inhaled bronchodilators, ceftriaxone and azithromycin.  Patient states that she feels much better now.  Notes "my chest feels better I can take a deep breath in".  She is wondering when she can go home.  We discussed that she really ought to stop smoking and patient states that she is aware of this but it is difficult since her husband also smokes.  Physical exam is notable for obese but well-appearing patient sitting up in bed without oxygen on, speaking in long sentences without difficulty.  No audible wheezing.  No respiratory effort.  Will continue present therapeutic plan as she is clinically improving..  Will add nicotine  patch and as needed MiraLAX per her request.  Wean oxygen as tolerated, patient is eager to go home.  I told her she would need to ambulate without oxygen and maintain O2 sats higher than 90% before she could go home, patient is agreeable.

## 2024-02-02 NOTE — Consult Note (Signed)
 Medical Consultation   GARNET CHATMON  ZOX:[id number]  DOB: Jun 28, 1986  DOA: 02/01/2024  PCP: Cyndi Drain, PA-C  Requesting physician: Dr. Racheal Buddle  Reason for consultation: Pneumonia  History of Present Illness: Ana Reyes is an 38 y.o. female [id number] at [redacted] weeks gestation, asthma, history of PE, history of gastric bypass, history of postpartum eclampsia, endometriosis, cervical dysplasia, chronic hep C, nephrolithiasis presented to [facility] service for evaluation of shortness of breath, wheezing, chest pain, and respiratory distress.  She is currently on Lovenox  for DVT prophylaxis.  Oxygen saturation in the high 80s on room air and required 2-3 L [place] to maintain sats in the 90s.  Afebrile.  Labs notable for WBC count 16.7, hemoglobin 9.1, MCV 91.0, COVID/influenza/RSV PCR negative.  CT angiogram chest negative for PE but showing multifocal pneumonia and trace right pleural effusion. Patient was given Solu-Medrol 125 mg and DuoNeb.  Hospitalist service consulted for management of pneumonia.  Patient is reporting shortness of breath, cough, wheezing, and chest tightness/pressure.  States symptoms started 2 months ago and have gotten progressively worse over time.  She reports multiple ED and urgent care visits and was taking prednisone  at home for the past 2 days.  Also using albuterol  inhaler as needed which has not helped.  Denies fevers or chills.  Cough is productive of white/clear sputum.  Denies any recent sick contacts.  No other complaints.  Review of Systems:  Review of Systems  All other systems reviewed and are negative.  Past Medical History: Past Medical History:  Diagnosis Date   Asthma    Cervical dysplasia    Endometriosis    Hx of hepatitis C    Hx pulmonary embolism    Kidney stones     Past Surgical History: Past Surgical History:  Procedure Laterality Date   CHOLECYSTECTOMY     GASTRIC BYPASS     LAPAROSCOPY       Allergies:   Allergies   Allergen Reactions   Latex Other (See Comments)    Causes redness.   Morphine  And Codeine Nausea Only   Toradol [Ketorolac Tromethamine] Rash    Tolerates ibuprofen    Tramadol Nausea And Vomiting and Rash     Social History:  reports that she has been smoking cigarettes. She has never used smokeless tobacco. She reports that she does not drink alcohol and does not use drugs.   Family History: Family History  Problem Relation Age of Onset   Anxiety disorder Mother    Anxiety disorder Father    ADD / ADHD Father    Bipolar disorder Sister    ADD / ADHD Brother    Anxiety disorder Brother    Heart attack Paternal Grandmother     Physical Exam: Vitals:   02/01/24 2330 02/02/24 0107 02/02/24 0227 02/02/24 0233  BP:  114/65  113/62  Pulse:  (!) 101  89  Resp:  (!) 24  20  Temp:  97.8 F (36.6 C)  97.8 F (36.6 C)  TempSrc:  Oral  Oral  SpO2: 96% 94% 99% 99%   Physical Exam Vitals reviewed.  Constitutional:      General: She is not in acute distress. HENT:     Head: Normocephalic and atraumatic.  Eyes:     Extraocular Movements: Extraocular movements intact.  Cardiovascular:     Rate and Rhythm: Normal rate and regular rhythm.     Pulses: Normal pulses.  Pulmonary:     Effort: No respiratory distress.     Breath sounds: Wheezing present.     Comments: Diffuse end expiratory wheezing Abdominal:     General: Bowel sounds are normal.     Palpations: Abdomen is soft.     Tenderness: There is no abdominal tenderness.  Musculoskeletal:     Cervical back: Normal range of motion.     Right lower leg: Edema present.     Left lower leg: Edema present.  Skin:    General: Skin is warm and dry.  Neurological:     General: No focal deficit present.     Mental Status: She is alert and oriented to person, place, and time.      Data reviewed:  I have personally reviewed following labs and imaging studies Labs:  CBC: Recent Labs  Lab 02/01/24 2001  WBC 16.7*  HGB  9.1*  HCT 28.2*  MCV 91.0  PLT 367    Basic Metabolic Panel: Recent Labs  Lab 02/01/24 2001  NA 138  K 4.0  CL 104  CO2 22  GLUCOSE 151*  BUN 9  CREATININE 0.68  CALCIUM 9.4   GFR Estimated Creatinine Clearance: 112.9 mL/min (by C-G formula based on SCr of 0.68 mg/dL). Liver Function Tests: Recent Labs  Lab 02/01/24 2001  AST 22  ALT 14  ALKPHOS 51  BILITOT 0.3  PROT 6.0*  ALBUMIN 2.7*   No results for input(s): "LIPASE", "AMYLASE" in the last 168 hours. No results for input(s): "AMMONIA" in the last 168 hours. Coagulation profile No results for input(s): "INR", "PROTIME" in the last 168 hours.  Cardiac Enzymes: No results for input(s): "CKTOTAL", "CKMB", "CKMBINDEX", "TROPONINI" in the last 168 hours. BNP: Invalid input(s): "POCBNP" CBG: No results for input(s): "GLUCAP" in the last 168 hours. D-Dimer No results for input(s): "DDIMER" in the last 72 hours. Hgb A1c No results for input(s): "HGBA1C" in the last 72 hours. Lipid Profile No results for input(s): "CHOL", "HDL", "LDLCALC", "TRIG", "CHOLHDL", "LDLDIRECT" in the last 72 hours. Thyroid function studies No results for input(s): "TSH", "T4TOTAL", "T3FREE", "THYROIDAB" in the last 72 hours.  Invalid input(s): "FREET3" Anemia work up No results for input(s): "VITAMINB12", "FOLATE", "FERRITIN", "TIBC", "IRON", "RETICCTPCT" in the last 72 hours. Urinalysis    Component Value Date/Time   COLORURINE Yellow 05/07/2014 1400   COLORURINE YELLOW 02/27/2014 1839   APPEARANCEUR Cloudy 05/07/2014 1400   LABSPEC 1.030 05/07/2014 1400   PHURINE 5.0 05/07/2014 1400   PHURINE 5.5 02/27/2014 1839   GLUCOSEU Negative 05/07/2014 1400   HGBUR Negative 05/07/2014 1400   HGBUR NEGATIVE 02/27/2014 1839   BILIRUBINUR Negative 05/07/2014 1400   KETONESUR Trace 05/07/2014 1400   KETONESUR NEGATIVE 02/27/2014 1839   PROTEINUR Negative 05/07/2014 1400   PROTEINUR NEGATIVE 02/27/2014 1839   UROBILINOGEN 0.2 02/27/2014  1839   NITRITE Negative 05/07/2014 1400   NITRITE NEGATIVE 02/27/2014 1839   LEUKOCYTESUR Negative 05/07/2014 1400     Microbiology Recent Results (from the past 240 hours)  Resp panel by RT-PCR (RSV, Flu A&B, Covid) Anterior Nasal Swab     Status: None   Collection Time: 02/01/24  8:20 PM   Specimen: Anterior Nasal Swab  Result Value Ref Range Status   SARS Coronavirus 2 by RT PCR NEGATIVE NEGATIVE Final   Influenza A by PCR NEGATIVE NEGATIVE Final   Influenza B by PCR NEGATIVE NEGATIVE Final    Comment: (NOTE) The Xpert Xpress SARS-CoV-2/FLU/RSV plus assay is intended as an aid  in the diagnosis of influenza from Nasopharyngeal swab specimens and should not be used as a sole basis for treatment. Nasal washings and aspirates are unacceptable for Xpert Xpress SARS-CoV-2/FLU/RSV testing.  Fact Sheet for Patients: BloggerCourse.com  Fact Sheet for Healthcare Providers: SeriousBroker.it  This test is not yet approved or cleared by the [place]  FDA and has been authorized for detection and/or diagnosis of SARS-CoV-2 by FDA under an Emergency Use Authorization (EUA). This EUA will remain in effect (meaning this test can be used) for the duration of the COVID-19 declaration under Section 564(b)(1) of the Act, 21 U.S.C. section 360bbb-3(b)(1), unless the authorization is terminated or revoked.     Resp Syncytial Virus by PCR NEGATIVE NEGATIVE Final    Comment: (NOTE) Fact Sheet for Patients: BloggerCourse.com  Fact Sheet for Healthcare Providers: SeriousBroker.it  This test is not yet approved or cleared by the [place]  FDA and has been authorized for detection and/or diagnosis of SARS-CoV-2 by FDA under an Emergency Use Authorization (EUA). This EUA will remain in effect (meaning this test can be used) for the duration of the COVID-19 declaration under Section  564(b)(1) of the Act, 21 U.S.C. section 360bbb-3(b)(1), unless the authorization is terminated or revoked.  Performed at [facility], [address] [place], [place] [address]        Inpatient Medications:   Scheduled Meds:  docusate sodium   100 mg Oral Daily   enoxaparin   50 mg Subcutaneous Q24H   prenatal multivitamin  1 tablet Oral Q1200   Continuous Infusions:   Radiological Exams on Admission: CT Angio Chest PE W and/or Wo Contrast Result Date: 02/01/2024 CLINICAL DATA:  Pulmonary embolism (PE) suspected, high prob. Short of breath EXAM: CT ANGIOGRAPHY CHEST WITH CONTRAST TECHNIQUE: Multidetector CT imaging of the chest was performed using the standard protocol during bolus administration of intravenous contrast. Multiplanar CT image reconstructions and MIPs were obtained to evaluate the vascular anatomy. RADIATION DOSE REDUCTION: This exam was performed according to the departmental dose-optimization program which includes automated exposure control, adjustment of the mA and/or kV according to patient size and/or use of iterative reconstruction technique. CONTRAST:  75mL OMNIPAQUE IOHEXOL 350 MG/ML SOLN COMPARISON:  Chest x-ray 02/01/2024 FINDINGS: Cardiovascular: Satisfactory opacification of the pulmonary arteries to the segmental level. No evidence of pulmonary embolism. Normal heart size. No significant pericardial effusion. The thoracic aorta is normal in caliber. No atherosclerotic plaque of the thoracic aorta. No coronary artery calcifications. Mediastinum/Nodes: Prominent but not enlarged left hilar and mediastinal lymph nodes. No enlarged mediastinal, hilar, or axillary lymph nodes. Thyroid gland, trachea, and esophagus demonstrate no significant findings. Lungs/Pleura: Right base atelectasis. Diffuse patchy ground-glass consolidative airspace opacities, left greater than right. No pulmonary nodule. No pulmonary mass. Trace right pleural effusion. No pneumothorax. 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 Status post cholecystectomy Musculoskeletal: No chest wall abnormality. No suspicious lytic or blastic osseous lesions. No acute displaced fracture. Review of the MIP images confirms the above findings. IMPRESSION: 1. No pulmonary embolus. 2. Multifocal pneumonia. 3. Trace right pleural effusion. Electronically Signed   By: Morgane  Naveau M.D.   On: 02/01/2024 21:40   DG Chest 2 View Result Date: 02/01/2024 CLINICAL DATA:  shortness of breath EXAM: CHEST - 2 VIEW COMPARISON:  CT angio chest 02/01/2024 FINDINGS: The heart and mediastinal contours are within normal limits. Diffuse patchy airspace opacities. No pulmonary edema. Trace bilateral pleural effusions. No pneumothorax. No acute osseous abnormality. IMPRESSION: Multifocal pneumonia with trace bilateral  pleural effusions. Followup PA and lateral chest X-ray is recommended in 3-4 weeks following therapy to ensure resolution. Electronically Signed   By: Morgane  Naveau M.D.   On: 02/01/2024 21:39    Impression/Recommendations Principal Problem:   Pneumonia affecting pregnancy in second trimester  Multifocal pneumonia WBC count 16.7, no fever or signs of sepsis at this time.  CT angiogram chest negative for PE but showing multifocal pneumonia and trace right pleural effusion.  COVID/influenza/RSV PCR negative.  Start ceftriaxone and azithromycin.  Blood cultures ordered.  Sputum Gram stain and culture ordered.  Strep pneumo/Legionella urinary antigens.  Respiratory viral panel ordered.  Trend WBC count.  Acute asthma exacerbation Continues to have wheezing.  She is comfortable when laying in bed but becomes slightly dyspneic with minimal exertion/sitting up in bed.  Satting well on 2 L [place].  Continue Solu-Medrol 60 mg every 12 hours, DuoNeb every 6 hours, Pulmicort neb twice daily, albuterol  neb every 4 hours PRN, Robitussin DM PRN cough.  Acute hypoxemic respiratory  failure Secondary to problems listed above.  Oxygen saturation in the high 80s on room air, currently satting in the mid to high 90s on 2 L supplemental oxygen via nasal cannula.  Continuous pulse ox, continue supplemental oxygen.  Chest pain/tightness Likely related to bronchospasm.  EKG showing sinus rhythm and no acute ischemic changes.  CT angiogram chest negative for PE.  Stat troponin x 2 ordered.  Anemia in the setting of pregnancy Hemoglobin currently 9.1 with MCV 91.0.  Hemoglobin was previously 11.6 on 12/25/2023.  Patient is currently not endorsing any symptoms of GI bleed, abdominal/pelvic pain, or vaginal bleeding.  Management per primary team, continue to monitor H&H.  Current pregnancy ([redacted] weeks gestation) Management per primary team.  Thank you for this consultation.  Our [facility] hospitalist team will follow the patient with you.  Time Spent: 60 minutes.  Juliette Oh M.D. [facility] 02/02/2024, 2:52 AM

## 2024-02-02 NOTE — Hospital Course (Signed)
 Hospital course:  38 y.o. female [id number] at [redacted] weeks gestation, asthma, history of PE, history of gastric bypass, history of postpartum eclampsia, endometriosis, cervical dysplasia, chronic hep C, nephrolithiasis was admitted for acute hypoxic failure secondary to multifocal PNA and asthma exacerbation. Started on Ceftriaxone and Azithromycin, steroids and Duonebs continued.    Subjective: ***    Exam:  General: *** Eyes: sclera anicteric, conjuctiva mild injection bilaterally CVS: S1-S2, regular  Respiratory:  decreased air entry bilaterally secondary to decreased inspiratory effort, rales at bases  GI: NABS, soft, NT  LE: Warm and well-perfused Neuro: A/O x 3,  grossly nonfocal.  Psych: patient is logical and coherent, judgement and insight appear normal, mood and affect appropriate to situation.  Assessment & Plan:    Multifocal pneumonia WBC count 16.7, no fever or signs of sepsis at this time.  CT angiogram chest negative for PE but showing multifocal pneumonia and trace right pleural effusion.  COVID/influenza/RSV PCR negative.  Start ceftriaxone and azithromycin.  Blood cultures ordered.  Sputum Gram stain and culture ordered.  Strep pneumo/Legionella urinary antigens.  Respiratory viral panel ordered.  Trend WBC count.   Acute asthma exacerbation Continues to have wheezing.  She is comfortable when laying in bed but becomes slightly dyspneic with minimal exertion/sitting up in bed.  Satting well on 2 L [place].  Continue Solu-Medrol 60 mg every 12 hours, DuoNeb every 6 hours, Pulmicort neb twice daily, albuterol  neb every 4 hours PRN, Robitussin DM PRN cough.   Acute hypoxemic respiratory failure Secondary to problems listed above.  Oxygen saturation in the high 80s on room air, currently satting in the mid to high 90s on 2 L supplemental oxygen via nasal cannula.  Continuous pulse ox, continue supplemental oxygen.   Chest pain/tightness Likely related to bronchospasm.  EKG  showing sinus rhythm and no acute ischemic changes.  CT angiogram chest negative for PE.  Stat troponin x 2 ordered.   Anemia in the setting of pregnancy Hemoglobin currently 9.1 with MCV 91.0.  Hemoglobin was previously 11.6 on 12/25/2023.  Patient is currently not endorsing any symptoms of GI bleed, abdominal/pelvic pain, or vaginal bleeding.  Management per primary team, continue to monitor H&H.   Current pregnancy ([redacted] weeks gestation) Management per primary team.        DVT prophylaxis: *** Code Status: *** Family Communication: *** Disposition:  ***

## 2024-02-03 DIAGNOSIS — J189 Pneumonia, unspecified organism: Secondary | ICD-10-CM | POA: Diagnosis not present

## 2024-02-03 DIAGNOSIS — O99512 Diseases of the respiratory system complicating pregnancy, second trimester: Secondary | ICD-10-CM | POA: Diagnosis not present

## 2024-02-03 DIAGNOSIS — Z3A00 Weeks of gestation of pregnancy not specified: Secondary | ICD-10-CM | POA: Diagnosis not present

## 2024-02-03 MED ORDER — NICOTINE 21 MG/24HR TD PT24: 21.0000 mg | MEDICATED_PATCH | Freq: Every day | TRANSDERMAL | 0 refills | Status: DC

## 2024-02-03 MED ORDER — AMOXICILLIN-POT CLAVULANATE 875-125 MG PO TABS: 1.0000 | ORAL_TABLET | Freq: Two times a day (BID) | ORAL | 0 refills | Status: AC

## 2024-02-03 MED ORDER — ALBUTEROL SULFATE (2.5 MG/3ML) 0.083% IN NEBU: 2.5000 mg | INHALATION_SOLUTION | RESPIRATORY_TRACT | 12 refills | Status: AC | PRN

## 2024-02-03 NOTE — H&P (Signed)
 [facility]ACT[facility] ANTEPARTUM ADMISSION HISTORY AND PHYSICAL NOTE   History of Present Illness: Ana Reyes is a 38 y.o. [id number] at [redacted]w[redacted]d admitted for community acquired pneumonia.  Pt presented with shortness of breath.  She had been seen several times for shortness of breath and asthma exacerbations.  During evaluation with Chest CT and chest x ray a community acquired pneumonia was diagnosed.  Pt was admitted for IV antibiotics and evaluation with the hospitalist team.  Patient reports uterine contraction  activity as none. Patient reports  vaginal bleeding as none. Patient describes fluid per vagina as None. Fetal presentation is variable.  Patient Active Problem List   Diagnosis Date Noted   Pneumonia affecting pregnancy in second trimester 02/02/2024   Missed period 11/21/2023   Positive urine pregnancy test 11/21/2023   Other fatigue 02/27/2023   Acute respiratory distress 01/10/2023   Chronic active hepatitis (HCC) 01/10/2023   History of depression 10/15/2022   GAD (generalized anxiety disorder) 10/15/2022   Extrinsic asthma 10/15/2022   Gastroesophageal reflux disease 10/15/2022   Iron deficiency anemia following bariatric surgery 10/15/2022   History of hepatitis C 10/15/2022   Sad mood 01/27/2019   Eclampsia, postpartum 11/19/2018   History of gastric bypass 09/16/2018   Mood disorder (HCC) 08/19/2018   Hepatitis C virus infection 10/08/2015   Endometriosis 12/09/2014   ADD (attention deficit disorder) 12/09/2014   Tobacco abuse 08/28/2013   Asthma 08/28/2013   History of pulmonary embolus (PE) 07/11/2006    Past Medical History:  Diagnosis Date   Asthma    Cervical dysplasia    Endometriosis    Hx of hepatitis C    Hx pulmonary embolism    Kidney stones     Past Surgical History:  Procedure Laterality Date   CHOLECYSTECTOMY     GASTRIC BYPASS     LAPAROSCOPY      OB History  Gravida Para Term Preterm AB Living  5 2 2  2 2   SAB IAB Ectopic  Multiple Live Births  2   0 2    # Outcome Date GA Lbr Len/2nd Weight Sex Type Anes PTL Lv  5 Current           4 Term 11/12/18 [redacted]w[redacted]d 08:50 / 00:19 2934 g F Vag-Spont EPI  LIV  3 Term 08/17/07     Vag-Spont   LIV  2 SAB           1 SAB             Social History   Socioeconomic History   Marital status: Single    Spouse name: Not on file   Number of children: 2   Years of education: Not on file   Highest education level: Not on file  Occupational History   Not on file  Tobacco Use   Smoking status: Every Day    Current packs/day: 1.00    Types: Cigarettes   Smokeless tobacco: Never  Substance and Sexual Activity   Alcohol use: No   Drug use: No   Sexual activity: Yes    Partners: Male  Other Topics Concern   Not on file  Social History Narrative   Not on file   Social Drivers of Health   Financial Resource Strain: Low Risk  (02/27/2023)   Overall Financial Resource Strain (CARDIA)    Difficulty of Paying Living Expenses: Not hard at all  Food Insecurity: No Food Insecurity (02/02/2024)   Hunger Vital Sign  Worried About Programme researcher, broadcasting/film/video in the Last Year: Never true    Ran Out of Food in the Last Year: Never true  Transportation Needs: No Transportation Needs (02/02/2024)   PRAPARE - Administrator, Civil Service (Medical): No    Lack of Transportation (Non-Medical): No  Physical Activity: Inactive (02/27/2023)   Exercise Vital Sign    Days of Exercise per Week: 0 days    Minutes of Exercise per Session: 0 min  Stress: No Stress Concern Present (02/27/2023)   [employer] of Occupational Health - Occupational Stress Questionnaire    Feeling of Stress : Not at all  Social Connections: Moderately Integrated (02/27/2023)   Social Connection and Isolation Panel [NHANES]    Frequency of Communication with Friends and Family: More than three times a week    Frequency of Social Gatherings with Friends and Family: More than three times a week     Attends Religious Services: More than 4 times per year    Active Member of [employer] or Organizations: No    Attends Banker Meetings: Never    Marital Status: Living with partner    Family History  Problem Relation Age of Onset   Anxiety disorder Mother    Anxiety disorder Father    ADD / ADHD Father    Bipolar disorder Sister    ADD / ADHD Brother    Anxiety disorder Brother    Heart attack Paternal Grandmother     Allergies  Allergen Reactions   Latex Other (See Comments)    Causes redness.   Morphine  And Codeine Nausea Only   Toradol [Ketorolac Tromethamine] Rash    Tolerates ibuprofen    Tramadol Nausea And Vomiting and Rash    No medications prior to admission.    Review of Systems - History obtained from the patient  Vitals:  BP 130/70 (BP Location: Left Arm)   Pulse 97   Temp 98 F (36.7 C) (Oral)   Resp 18   LMP 09/09/2023 (Approximate)   SpO2 95%  Physical Examination: from nurse midwife CONSTITUTIONAL: Well-developed, obese,well-nourished female in no mild distress.  HENT:  Normocephalic, atraumatic, External right and left ear normal. Oropharynx is clear and moist EYES: Conjunctivae and EOM are normal.  NECK: Normal range of motion, supple, no masses SKIN: Skin is warm and dry. No rash noted. Not diaphoretic. No erythema. No pallor. NEUROLGIC: Alert and oriented to person, place, and time. Normal reflexes, muscle tone coordination. No cranial nerve deficit noted. PSYCHIATRIC: mood is anxious CARDIOVASCULAR: Normal heart rate noted, regular rhythm RESPIRATORY: Effort: Tachypnea and accessory muscle usage present.     Breath sounds: Examination of the right-upper field reveals wheezing. Examination of the left-upper field reveals wheezing. Examination of the right-middle field reveals wheezing. Examination of the left-middle field reveals wheezing. Examination of the right-lower field reveals wheezing. Examination of the left-lower field reveals  wheezing. Wheezing present.  ABDOMEN: Soft, nontender, nondistended, gravid. MUSCULOSKELETAL: Normal range of motion. No edema and no tenderness. 2+ distal pulses.  Cervix: deferred  Labs:  No results found for this or any previous visit (from the past 24 hours).  Imaging Studies: CT Angio Chest PE W and/or Wo Contrast Result Date: 02/01/2024 CLINICAL DATA:  Pulmonary embolism (PE) suspected, high prob. Short of breath EXAM: CT ANGIOGRAPHY CHEST WITH CONTRAST TECHNIQUE: Multidetector CT imaging of the chest was performed using the standard protocol during bolus administration of intravenous contrast. Multiplanar CT image reconstructions and MIPs were obtained to  evaluate the vascular anatomy. RADIATION DOSE REDUCTION: This exam was performed according to the departmental dose-optimization program which includes automated exposure control, adjustment of the mA and/or kV according to patient size and/or use of iterative reconstruction technique. CONTRAST:  75mL OMNIPAQUE IOHEXOL 350 MG/ML SOLN COMPARISON:  Chest x-ray 02/01/2024 FINDINGS: Cardiovascular: Satisfactory opacification of the pulmonary arteries to the segmental level. No evidence of pulmonary embolism. Normal heart size. No significant pericardial effusion. The thoracic aorta is normal in caliber. No atherosclerotic plaque of the thoracic aorta. No coronary artery calcifications. Mediastinum/Nodes: Prominent but not enlarged left hilar and mediastinal lymph nodes. No enlarged mediastinal, hilar, or axillary lymph nodes. Thyroid gland, trachea, and esophagus demonstrate no significant findings. Lungs/Pleura: Right base atelectasis. Diffuse patchy ground-glass consolidative airspace opacities, left greater than right. No pulmonary nodule. No pulmonary mass. Trace right pleural effusion. No pneumothorax. 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 Status post cholecystectomy Musculoskeletal: No chest wall  abnormality. No suspicious lytic or blastic osseous lesions. No acute displaced fracture. Review of the MIP images confirms the above findings. IMPRESSION: 1. No pulmonary embolus. 2. Multifocal pneumonia. 3. Trace right pleural effusion. Electronically Signed   By: Morgane  Naveau M.D.   On: 02/01/2024 21:40   DG Chest 2 View Result Date: 02/01/2024 CLINICAL DATA:  shortness of breath EXAM: CHEST - 2 VIEW COMPARISON:  CT angio chest 02/01/2024 FINDINGS: The heart and mediastinal contours are within normal limits. Diffuse patchy airspace opacities. No pulmonary edema. Trace bilateral pleural effusions. No pneumothorax. No acute osseous abnormality. IMPRESSION: Multifocal pneumonia with trace bilateral pleural effusions. Followup PA and lateral chest X-ray is recommended in 3-4 weeks following therapy to ensure resolution. Electronically Signed   By: Morgane  Naveau M.D.   On: 02/01/2024 21:39     Assessment and Plan: Patient Active Problem List   Diagnosis Date Noted   Pneumonia affecting pregnancy in second trimester 02/02/2024   Missed period 11/21/2023   Positive urine pregnancy test 11/21/2023   Other fatigue 02/27/2023   Acute respiratory distress 01/10/2023   Chronic active hepatitis (HCC) 01/10/2023   History of depression 10/15/2022   GAD (generalized anxiety disorder) 10/15/2022   Extrinsic asthma 10/15/2022   Gastroesophageal reflux disease 10/15/2022   Iron deficiency anemia following bariatric surgery 10/15/2022   History of hepatitis C 10/15/2022   Sad mood 01/27/2019   Eclampsia, postpartum 11/19/2018   History of gastric bypass 09/16/2018   Mood disorder (HCC) 08/19/2018   Hepatitis C virus infection 10/08/2015   Endometriosis 12/09/2014   ADD (attention deficit disorder) 12/09/2014   Tobacco abuse 08/28/2013   Asthma 08/28/2013   History of pulmonary embolus (PE) 07/11/2006   Admit to Antenatal Pt admitted for oxygen therapy and IV antibiotics per Hospitalist  team. Lovenox  ordered due to hx of PE along with hypercoagulable state (pregnancy)    Avie Boeck, MD Faculty attending, [facility]

## 2024-02-03 NOTE — Progress Notes (Signed)
   02/03/24 1339  Departure Condition  Departure Condition Good  Mobility at [facility]  Patient/Caregiver Teaching Teach Back Method Used;Discharge instructions reviewed;Prescriptions reviewed;Follow-up care reviewed;Patient/caregiver verbalized understanding;Medications discussed;Pain management discussed;Admission discussed  Departure Mode With significant other  Was procedural sedation performed on this patient during this visit? No   Patient alert and oriented x4, VS and pain stable.

## 2024-02-03 NOTE — Discharge Summary (Signed)
 Antenatal Physician Discharge Summary  Patient ID: Ana Reyes MRN: [id number] DOB/AGE: 1986/04/29 38 y.o.  Admit date: 02/01/2024 Discharge date: 02/03/2024  Admission Diagnoses:  [redacted] week gestation, community acquired pneumonia  Discharge Diagnoses: [redacted] week gestation, community acquired pneumonia  Prenatal Procedures: none  Consults: Inpatient Hospitalist team  Hospital Course:  Ana Reyes is a 38 y.o. [id number] with IUP at [redacted]w[redacted]d admitted for shortness of breath and community acquired pneumonia. Pt was admitted to receive supplemental oxygen as well as IV antibiotics and breathing treatments.  Pt responded well to treatment and was able to be weaned off oxygen.  Pt was cleared for discharge  by the medicine team and advised to finish oral antibiotics as well as considering smoking exacerbation since it is making the condition worse.  She was deemed stable for discharge to home with outpatient follow up.  Discharge Exam: Temp:  [97.5 F (36.4 C)-98 F (36.7 C)] 98 F (36.7 C) (05/26 1210) Pulse Rate:  [82-104] 90 (05/26 1210) Resp:  [16-18] 18 (05/26 1210) BP: (108-124)/(63-76) 119/66 (05/26 1210) SpO2:  [95 %-100 %] 95 % (05/26 1210) Physical Examination: CONSTITUTIONAL: Well-developed, obese, well-nourished female in no acute distress.  HENT:  Normocephalic, atraumatic, External right and left ear normal. Oropharynx is clear and moist EYES: Conjunctivae and EOM are normal.  NECK: Normal range of motion, supple, no masses SKIN: Skin is warm and dry. No rash noted. Not diaphoretic. No erythema. No pallor. NEUROLGIC: Alert and oriented to person, place, and time. Normal reflexes, muscle tone coordination. No cranial nerve deficit noted. PSYCHIATRIC: Normal mood and affect. Normal behavior. Normal judgment and thought content. CARDIOVASCULAR: Normal heart rate noted, regular rhythm RESPIRATORY: Bilateral upper  lobe expiratory wheezing, no problems with respiration  noted MUSCULOSKELETAL: Normal range of motion. No edema and no tenderness. 2+ distal pulses. ABDOMEN: Soft, nontender, nondistended, gravid. CERVIX:  deferred  Significant Diagnostic Studies:  Results for orders placed or performed during the hospital encounter of 02/01/24 (from the past week)  Pregnancy, urine POC   Collection Time: 02/01/24  6:59 PM  Result Value Ref Range   Preg Test, Ur POSITIVE (A) NEGATIVE  CBC   Collection Time: 02/01/24  8:01 PM  Result Value Ref Range   WBC 16.7 (H) 4.0 - 10.5 K/uL   RBC 3.10 (L) 3.87 - 5.11 MIL/uL   Hemoglobin 9.1 (L) 12.0 - 15.0 g/dL   HCT 14.7 (L) 82.9 - 56.2 %   MCV 91.0 80.0 - 100.0 fL   MCH 29.4 26.0 - 34.0 pg   MCHC 32.3 30.0 - 36.0 g/dL   RDW 13.0 (H) 86.5 - 78.4 %   Platelets 367 150 - 400 K/uL   nRBC 0.0 0.0 - 0.2 %  Comprehensive metabolic panel   Collection Time: 02/01/24  8:01 PM  Result Value Ref Range   Sodium 138 135 - 145 mmol/L   Potassium 4.0 3.5 - 5.1 mmol/L   Chloride 104 98 - 111 mmol/L   CO2 22 22 - 32 mmol/L   Glucose, Bld 151 (H) 70 - 99 mg/dL   BUN 9 6 - 20 mg/dL   Creatinine, Ser 6.96 0.44 - 1.00 mg/dL   Calcium 9.4 8.9 - 29.5 mg/dL   Total Protein 6.0 (L) 6.5 - 8.1 g/dL   Albumin 2.7 (L) 3.5 - 5.0 g/dL   AST 22 15 - 41 U/L   ALT 14 0 - 44 U/L   Alkaline Phosphatase 51 38 - 126 U/L   Total Bilirubin  0.3 0.0 - 1.2 mg/dL   GFR, Estimated >16 >10 mL/min   Anion gap 12 5 - 15  Resp panel by RT-PCR (RSV, Flu A&B, Covid) Anterior Nasal Swab   Collection Time: 02/01/24  8:20 PM   Specimen: Anterior Nasal Swab  Result Value Ref Range   SARS Coronavirus 2 by RT PCR NEGATIVE NEGATIVE   Influenza A by PCR NEGATIVE NEGATIVE   Influenza B by PCR NEGATIVE NEGATIVE   Resp Syncytial Virus by PCR NEGATIVE NEGATIVE  Type and screen [facility]   Collection Time: 02/02/24  2:57 AM  Result Value Ref Range   ABO/RH(D) O POS    Antibody Screen NEG    Sample Expiration       02/05/2024,2359 Performed at [facility], [address] [place], [place] [address]   Culture, blood (Routine X 2) w Reflex to ID Panel   Collection Time: 02/02/24  4:08 AM   Specimen: BLOOD LEFT HAND  Result Value Ref Range   Specimen Description BLOOD LEFT HAND    Special Requests      BOTTLES DRAWN AEROBIC AND ANAEROBIC Blood Culture results may not be optimal due to an inadequate volume of blood received in culture bottles   Culture      NO GROWTH 1 DAY Performed at [facility], [address] [place], [place] [address]    Report Status PENDING   Culture, blood (Routine X 2) w Reflex to ID Panel   Collection Time: 02/02/24  4:08 AM   Specimen: BLOOD LEFT ARM  Result Value Ref Range   Specimen Description BLOOD LEFT ARM    Special Requests      BOTTLES DRAWN AEROBIC AND ANAEROBIC Blood Culture results may not be optimal due to an inadequate volume of blood received in culture bottles   Culture      NO GROWTH 1 DAY Performed at [facility], [address] [place], [place] [address]    Report Status PENDING   CBC   Collection Time: 02/02/24  4:08 AM  Result Value Ref Range   WBC 15.5 (H) 4.0 - 10.5 K/uL   RBC 3.06 (L) 3.87 - 5.11 MIL/uL   Hemoglobin 8.9 (L) 12.0 - 15.0 g/dL   HCT 82.9 (L) 56.2 - 13.0 %   MCV 90.8 80.0 - 100.0 fL   MCH 29.1 26.0 - 34.0 pg   MCHC 32.0 30.0 - 36.0 g/dL   RDW 86.5 (H) 78.4 - 69.6 %   Platelets 333 150 - 400 K/uL   nRBC 0.0 0.0 - 0.2 %  Troponin I (High Sensitivity)   Collection Time: 02/02/24  4:08 AM  Result Value Ref Range   Troponin I (High Sensitivity) 5 <18 ng/L  Expectorated Sputum Assessment w Gram Stain, Rflx to Resp Cult   Collection Time: 02/02/24  4:28 AM   Specimen: Sputum  Result Value Ref Range   Specimen Description SPUTUM    Special Requests NONE    Sputum evaluation      THIS SPECIMEN IS ACCEPTABLE FOR SPUTUM CULTURE Performed at [facility], [address], [place], [place] [address]     Report Status 02/02/2024 FINAL   Respiratory (~20 pathogens) panel by PCR   Collection Time: 02/02/24  4:28 AM   Specimen: Expectorated Sputum; Respiratory  Result Value Ref Range   Adenovirus NOT DETECTED NOT DETECTED   Coronavirus 229E NOT DETECTED NOT DETECTED   Coronavirus HKU1 NOT DETECTED  NOT DETECTED   Coronavirus NL63 NOT DETECTED NOT DETECTED   Coronavirus OC43 NOT DETECTED NOT DETECTED   Metapneumovirus NOT DETECTED NOT DETECTED   Rhinovirus / Enterovirus NOT DETECTED NOT DETECTED   Influenza A NOT DETECTED NOT DETECTED   Influenza B NOT DETECTED NOT DETECTED   Parainfluenza Virus 1 NOT DETECTED NOT DETECTED   Parainfluenza Virus 2 NOT DETECTED NOT DETECTED   Parainfluenza Virus 3 NOT DETECTED NOT DETECTED   Parainfluenza Virus 4 NOT DETECTED NOT DETECTED   Respiratory Syncytial Virus NOT DETECTED NOT DETECTED   Bordetella pertussis NOT DETECTED NOT DETECTED   Bordetella Parapertussis NOT DETECTED NOT DETECTED   Chlamydophila pneumoniae NOT DETECTED NOT DETECTED   Mycoplasma pneumoniae NOT DETECTED NOT DETECTED  Culture, Respiratory w Gram Stain   Collection Time: 02/02/24  4:28 AM   Specimen: SPU  Result Value Ref Range   Specimen Description SPUTUM    Special Requests NONE Reflexed from [id number]    Gram Stain      FEW SQUAMOUS EPITHELIAL CELLS PRESENT FEW WBC PRESENT,BOTH PMN AND MONONUCLEAR FEW GRAM POSITIVE COCCI IN PAIRS AND CHAINS    Culture      CULTURE REINCUBATED FOR BETTER GROWTH Performed at [facility], [address], [place], [place] [address]    Report Status PENDING   Strep pneumoniae urinary antigen   Collection Time: 02/02/24  4:28 AM  Result Value Ref Range   Strep Pneumo Urinary Antigen NEGATIVE NEGATIVE  Troponin I (High Sensitivity)   Collection Time: 02/02/24  5:40 AM  Result Value Ref Range   Troponin I (High Sensitivity) 11 <18 ng/L  Group A Strep by PCR   Collection Time: 02/02/24 10:15 AM   Specimen: Throat; Sterile Swab   Result Value Ref Range   Group A Strep by PCR NOT DETECTED NOT DETECTED  Vitamin B12   Collection Time: 02/02/24 11:48 AM  Result Value Ref Range   Vitamin B-12 252 180 - 914 pg/mL  Folate   Collection Time: 02/02/24 11:48 AM  Result Value Ref Range   Folate 21.8 >5.9 ng/mL  Iron and TIBC   Collection Time: 02/02/24 11:48 AM  Result Value Ref Range   Iron 25 (L) 28 - 170 ug/dL   TIBC 295 621 - 308 ug/dL   Saturation Ratios 6 (L) 10.4 - 31.8 %   UIBC 412 ug/dL  Ferritin   Collection Time: 02/02/24 11:48 AM  Result Value Ref Range   Ferritin 11 11 - 307 ng/mL  Reticulocytes   Collection Time: 02/02/24 11:48 AM  Result Value Ref Range   Retic Ct Pct 1.8 0.4 - 3.1 %   RBC. 3.17 (L) 3.87 - 5.11 MIL/uL   Retic Count, Absolute 57.4 19.0 - 186.0 K/uL   Immature Retic Fract 29.9 (H) 2.3 - 15.9 %   CT Angio Chest PE W and/or Wo Contrast Result Date: 02/01/2024 CLINICAL DATA:  Pulmonary embolism (PE) suspected, high prob. Short of breath EXAM: CT ANGIOGRAPHY CHEST WITH CONTRAST TECHNIQUE: Multidetector CT imaging of the chest was performed using the standard protocol during bolus administration of intravenous contrast. Multiplanar CT image reconstructions and MIPs were obtained to evaluate the vascular anatomy. RADIATION DOSE REDUCTION: This exam was performed according to the departmental dose-optimization program which includes automated exposure control, adjustment of the mA and/or kV according to patient size and/or use of iterative reconstruction technique. CONTRAST:  75mL OMNIPAQUE IOHEXOL 350 MG/ML SOLN COMPARISON:  Chest x-ray 02/01/2024 FINDINGS: Cardiovascular: Satisfactory opacification of the  pulmonary arteries to the segmental level. No evidence of pulmonary embolism. Normal heart size. No significant pericardial effusion. The thoracic aorta is normal in caliber. No atherosclerotic plaque of the thoracic aorta. No coronary artery calcifications. Mediastinum/Nodes: Prominent but not  enlarged left hilar and mediastinal lymph nodes. No enlarged mediastinal, hilar, or axillary lymph nodes. Thyroid gland, trachea, and esophagus demonstrate no significant findings. Lungs/Pleura: Right base atelectasis. Diffuse patchy ground-glass consolidative airspace opacities, left greater than right. No pulmonary nodule. No pulmonary mass. Trace right pleural effusion. No pneumothorax. 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 Status post cholecystectomy Musculoskeletal: No chest wall abnormality. No suspicious lytic or blastic osseous lesions. No acute displaced fracture. Review of the MIP images confirms the above findings. IMPRESSION: 1. No pulmonary embolus. 2. Multifocal pneumonia. 3. Trace right pleural effusion. Electronically Signed   By: Morgane  Naveau M.D.   On: 02/01/2024 21:40   DG Chest 2 View Result Date: 02/01/2024 CLINICAL DATA:  shortness of breath EXAM: CHEST - 2 VIEW COMPARISON:  CT angio chest 02/01/2024 FINDINGS: The heart and mediastinal contours are within normal limits. Diffuse patchy airspace opacities. No pulmonary edema. Trace bilateral pleural effusions. No pneumothorax. No acute osseous abnormality. IMPRESSION: Multifocal pneumonia with trace bilateral pleural effusions. Followup PA and lateral chest X-ray is recommended in 3-4 weeks following therapy to ensure resolution. Electronically Signed   By: Morgane  Naveau M.D.   On: 02/01/2024 21:39    No future appointments.  Discharge Condition: Stable  Discharge disposition: 01-Home or Self Care       Discharge Instructions     Discharge activity:  No Restrictions   Complete by: As directed    Discharge diet:   Complete by: As directed    No sexual activity restrictions   Complete by: As directed    Notify physician for a general feeling that "something is not right"   Complete by: As directed    Notify physician for increase or change in vaginal discharge   Complete by:  As directed    Notify physician for intestinal cramps, with or without diarrhea, sometimes described as "gas pain"   Complete by: As directed    Notify physician for leaking of fluid   Complete by: As directed    Notify physician for low, dull backache, unrelieved by heat or Tylenol    Complete by: As directed    Notify physician for menstrual like cramps   Complete by: As directed    Notify physician for pelvic pressure   Complete by: As directed    Notify physician for uterine contractions.  These may be painless and feel like the uterus is tightening or the baby is  "balling up"   Complete by: As directed    Notify physician for vaginal bleeding   Complete by: As directed    PRETERM LABOR:  Includes any of the follwing symptoms that occur between 20 - [redacted] weeks gestation.  If these symptoms are not stopped, preterm labor can result in preterm delivery, placing your baby at risk   Complete by: As directed       Allergies as of 02/03/2024       Reactions   Latex Other (See Comments)   Causes redness.   Morphine  And Codeine Nausea Only   Toradol [ketorolac Tromethamine] Rash   Tolerates ibuprofen    Tramadol Nausea And Vomiting, Rash        Medication List     STOP taking these medications    albuterol  (  5 MG/ML) 0.5% nebulizer solution Commonly known as: PROVENTIL  Replaced by: albuterol  (2.5 MG/3ML) 0.083% nebulizer solution You also have another medication with the same name that you need to continue taking as instructed.   omeprazole  40 MG capsule Commonly known as: PRILOSEC   predniSONE  20 MG tablet Commonly known as: DELTASONE        TAKE these medications    albuterol  108 (90 Base) MCG/ACT inhaler Commonly known as: Ventolin  HFA Inhale 2 puffs into the lungs every 6 (six) hours as needed for wheezing or shortness of breath. What changed:  Another medication with the same name was added. Make sure you understand how and when to take each. Another medication  with the same name was removed. Continue taking this medication, and follow the directions you see here.   albuterol  (2.5 MG/3ML) 0.083% nebulizer solution Commonly known as: PROVENTIL  Take 3 mLs (2.5 mg total) by nebulization every 4 (four) hours as needed for wheezing or shortness of breath. What changed: You were already taking a medication with the same name, and this prescription was added. Make sure you understand how and when to take each. Replaces: albuterol  (5 MG/ML) 0.5% nebulizer solution   amoxicillin-clavulanate 875-125 MG tablet Commonly known as: AUGMENTIN Take 1 tablet by mouth 2 (two) times daily for 5 days.   calcium carbonate 500 MG chewable tablet Commonly known as: TUMS - dosed in mg elemental calcium Chew 2 tablets by mouth 3 (three) times daily as needed for indigestion or heartburn.   cetirizine  10 MG tablet Commonly known as: ZyrTEC  Allergy Take 1 tablet (10 mg total) by mouth daily.   diphenhydrAMINE  25 mg capsule Commonly known as: BENADRYL  Take 25 mg by mouth at bedtime as needed.   enoxaparin  40 MG/0.4ML injection Commonly known as: LOVENOX  40 MG injection once daily.   fluticasone 220 MCG/ACT inhaler Commonly known as: FLOVENT HFA Inhale 2 puffs into the lungs in the morning and at bedtime.   folic acid 1 MG tablet Commonly known as: FOLVITE Take 1 tablet by mouth daily.   nicotine  21 mg/24hr patch Commonly known as: NICODERM CQ  - dosed in mg/24 hours Place 1 patch (21 mg total) onto the skin daily. Start taking on: Feb 04, 2024   Prenatal Complete Cppk 1 po qd        Follow-up Information     [facility] Go on 02/05/2024.   Why: hospital follow up, previously scheduled appt Contact information: [address] [place], [place] [address]                Total discharge time: 30 minutes   Signed: Abigail Abler M.D. 02/03/2024, 1:15 PM

## 2024-02-03 NOTE — Progress Notes (Signed)
 Consultation Progress Note     Ana Reyes  WUJ:[id number]  DOB: 06-May-1986  DOA: 02/01/2024 PCP: Cyndi Drain, PA-C Outpatient Specialists:   Hospital course:   38 year old G5 P2-0-2-2 female at [redacted] weeks gestation, has history of PE, postpartum eclampsia, chronic hepatitis C and ongoing tobacco user who was admitted earlier this morning for multifocal pneumonia and likely COPD flare. COVID influenza and RSV are negative, CTA ruled out PE.  Patient has been treated with Solu-Medrol, inhaled bronchodilators, ceftriaxone and azithromycin.    Subjective:  Patient states she feels much much better, has been off oxygen since yesterday afternoon.  Was able to walk all the way around the ward without oxygen and feels fine.  Requesting to go home.   Objective: Vitals:   02/03/24 0202 02/03/24 0900 02/03/24 0904 02/03/24 1000  BP:      Pulse:      Resp:  16    Temp:  97.9 F (36.6 C)    TempSrc:  Oral    SpO2: 99% 97% 100% 100%   No intake or output data in the 24 hours ending 02/03/24 1145 There were no vitals filed for this visit.   Exam:  General: Appearing female sitting up in bed having just bathed in NAD Eyes: sclera anicteric, conjuctiva mild injection bilaterally CVS: S1-S2, regular  Respiratory: Decreased air entry with diminished excursion due to pregnancy, no adventitious sounds GI: NABS, soft, NT  LE: Warm and well-perfused Neuro: A/O x 3,  grossly nonfocal.  Psych: patient is logical and coherent, judgement and insight appear normal, mood and affect appropriate to situation.  Data Reviewed:  Basic Metabolic Panel: Recent Labs  Lab 02/01/24 2001  NA 138  K 4.0  CL 104  CO2 22  GLUCOSE 151*  BUN 9  CREATININE 0.68  CALCIUM 9.4    CBC: Recent Labs  Lab 02/01/24 2001 02/02/24 0408  WBC 16.7* 15.5*  HGB 9.1* 8.9*  HCT 28.2* 27.8*  MCV 91.0 90.8  PLT 367 333     Scheduled Meds:  budesonide  1 mg Nebulization BID   docusate sodium   100  mg Oral Daily   enoxaparin   50 mg Subcutaneous Q24H   folic acid  1 mg Oral Daily   ipratropium-albuterol   3 mL Nebulization TID   methylPREDNISolone sodium succinate  60 mg Intravenous Q12H   nicotine   21 mg Transdermal Daily   pantoprazole   40 mg Oral Daily   polyethylene glycol  17 g Oral Daily   prenatal multivitamin  1 tablet Oral Q1200   Continuous Infusions:  azithromycin 500 mg (02/03/24 0650)   cefTRIAXone (ROCEPHIN)  IV 2 g (02/02/24 1735)     Assessment & Plan:   CAP/multifocal Patient has responded well to ceftriaxone azithromycin Is off oxygen and is ambulating well without difficulty Recommend discharge home on Augmentin to complete a 7 to 10-day course Patient needs to follow-up with her PCP in 1 week  Acute asthma exacerbation Ongoing tobacco use Husband also smokes We discussed at length the extremely deleterious effect of smoke on her lungs and also the lungs of her children. I have strongly encouraged patient to stop smoking. Patient has completed a 5-day course of prednisone  on 5/23 as an outpatient, she received Solu-Medrol in house, is no longer wheezing, will not provide further steroids, she will need to follow-up with her PCP in the next couple of days Continue inhaled bronchodilators    Studies: CT Angio Chest PE W and/or Wo Contrast Result  Date: 02/01/2024 CLINICAL DATA:  Pulmonary embolism (PE) suspected, high prob. Short of breath EXAM: CT ANGIOGRAPHY CHEST WITH CONTRAST TECHNIQUE: Multidetector CT imaging of the chest was performed using the standard protocol during bolus administration of intravenous contrast. Multiplanar CT image reconstructions and MIPs were obtained to evaluate the vascular anatomy. RADIATION DOSE REDUCTION: This exam was performed according to the departmental dose-optimization program which includes automated exposure control, adjustment of the mA and/or kV according to patient size and/or use of iterative reconstruction  technique. CONTRAST:  75mL OMNIPAQUE IOHEXOL 350 MG/ML SOLN COMPARISON:  Chest x-ray 02/01/2024 FINDINGS: Cardiovascular: Satisfactory opacification of the pulmonary arteries to the segmental level. No evidence of pulmonary embolism. Normal heart size. No significant pericardial effusion. The thoracic aorta is normal in caliber. No atherosclerotic plaque of the thoracic aorta. No coronary artery calcifications. Mediastinum/Nodes: Prominent but not enlarged left hilar and mediastinal lymph nodes. No enlarged mediastinal, hilar, or axillary lymph nodes. Thyroid gland, trachea, and esophagus demonstrate no significant findings. Lungs/Pleura: Right base atelectasis. Diffuse patchy ground-glass consolidative airspace opacities, left greater than right. No pulmonary nodule. No pulmonary mass. Trace right pleural effusion. No pneumothorax. 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 Status post cholecystectomy Musculoskeletal: No chest wall abnormality. No suspicious lytic or blastic osseous lesions. No acute displaced fracture. Review of the MIP images confirms the above findings. IMPRESSION: 1. No pulmonary embolus. 2. Multifocal pneumonia. 3. Trace right pleural effusion. Electronically Signed   By: Morgane  Naveau M.D.   On: 02/01/2024 21:40   DG Chest 2 View Result Date: 02/01/2024 CLINICAL DATA:  shortness of breath EXAM: CHEST - 2 VIEW COMPARISON:  CT angio chest 02/01/2024 FINDINGS: The heart and mediastinal contours are within normal limits. Diffuse patchy airspace opacities. No pulmonary edema. Trace bilateral pleural effusions. No pneumothorax. No acute osseous abnormality. IMPRESSION: Multifocal pneumonia with trace bilateral pleural effusions. Followup PA and lateral chest X-ray is recommended in 3-4 weeks following therapy to ensure resolution. Electronically Signed   By: Morgane  Naveau M.D.   On: 02/01/2024 21:39    Principal Problem:   Pneumonia affecting pregnancy  in second trimester Active Problems:   Hepatitis C virus infection   History of pulmonary embolus (PE)   Asthma     Analyse [facility] Rollene Clink, [facility]  If 7PM-7AM, please contact night-coverage [email or web address]   LOS: 1 day

## 2024-02-04 ENCOUNTER — Ambulatory Visit: Payer: Self-pay | Admitting: Family Medicine

## 2024-02-04 LAB — LEGIONELLA PNEUMOPHILA SEROGP 1 UR AG: L. pneumophila Serogp 1 Ur Ag: NEGATIVE

## 2024-02-04 LAB — CULTURE, RESPIRATORY W GRAM STAIN

## 2024-02-06 ENCOUNTER — Telehealth: Payer: Self-pay

## 2024-02-06 DIAGNOSIS — Z86711 Personal history of pulmonary embolism: Secondary | ICD-10-CM

## 2024-02-06 DIAGNOSIS — J4551 Severe persistent asthma with (acute) exacerbation: Secondary | ICD-10-CM

## 2024-02-06 NOTE — Telephone Encounter (Addendum)
 Im unsure if a new referral would need to be placed. Looks like the other one has been closed.  Copied from CRM [id number]. Topic: Referral - Request for Referral >> Feb 06, 2024 11:18 AM [employer] wrote: Did the patient discuss referral with their provider in the last year? Yes (If No - schedule appointment) (If Yes - send message)  Appointment offered? No  Type of order/referral and detailed reason for visit: Pulmonary diseases  Preference of office, provider, location:  [facility] [address] [place], [place] [address] Phone: [phone] Fax: [phone]  If referral order, have you been seen by this specialty before? No (If Yes, this issue or another issue? When? Where?  Can we respond through MyChart? Yes

## 2024-02-06 NOTE — Telephone Encounter (Signed)
 Copied from CRM [id number]. Topic: Referral - Request for Referral >> Feb 06, 2024 11:18 AM [employer] wrote: Did the patient discuss referral with their provider in the last year? Yes (If No - schedule appointment) (If Yes - send message)  Appointment offered? No  Type of order/referral and detailed reason for visit: Pulmonary diseases  Preference of office, provider, location:  [facility] [address] [place], [place] [address] Phone: [phone] Fax: [phone]  If referral order, have you been seen by this specialty before? No (If Yes, this issue or another issue? When? Where?  Can we respond through MyChart? Yes

## 2024-02-07 LAB — CULTURE, BLOOD (ROUTINE X 2)
Culture: NO GROWTH
Culture: NO GROWTH

## 2024-02-11 ENCOUNTER — Encounter: Payer: Self-pay | Admitting: Physician Assistant

## 2024-03-07 ENCOUNTER — Other Ambulatory Visit: Payer: Self-pay | Admitting: Family Medicine

## 2024-03-07 DIAGNOSIS — K219 Gastro-esophageal reflux disease without esophagitis: Secondary | ICD-10-CM

## 2024-03-19 ENCOUNTER — Other Ambulatory Visit: Payer: Self-pay | Admitting: Physician Assistant

## 2024-03-19 ENCOUNTER — Other Ambulatory Visit: Payer: Self-pay | Admitting: Family Medicine

## 2024-03-19 DIAGNOSIS — J4520 Mild intermittent asthma, uncomplicated: Secondary | ICD-10-CM

## 2024-03-19 DIAGNOSIS — J4551 Severe persistent asthma with (acute) exacerbation: Secondary | ICD-10-CM

## 2024-03-19 MED ORDER — ALBUTEROL SULFATE HFA 108 (90 BASE) MCG/ACT IN AERS: 2.0000 | INHALATION_SPRAY | Freq: Four times a day (QID) | RESPIRATORY_TRACT | 2 refills | Status: AC | PRN

## 2024-03-19 NOTE — Telephone Encounter (Signed)
 Call pharmacy ---- they need to reach out to her OB/GYN for refill of lovenox  --- unsure if she is still on this or not

## 2024-03-19 NOTE — Telephone Encounter (Signed)
 Copied from CRM [id number]. Topic: Clinical - Medication Refill >> Mar 19, 2024 12:34 PM Antwanette L wrote: Medication: enoxaparin  (LOVENOX ) 40 MG/0.4ML injection and albuterol  (VENTOLIN  HFA) 108 (90 Base) MCG/ACT inhaler  Has the patient contacted their pharmacy? Yes   This is the patient's preferred pharmacy:  [facility] #[id number] [facility], [place] - [address] [place] [address] Phone: [phone] Fax: [phone]  Is this the correct pharmacy for this prescription? Yes   Has the prescription been filled recently? No  Is the patient out of the medication? Yes  Has the patient been seen for an appointment in the last year OR does the patient have an upcoming appointment? Yes. Last ov was on 01/23/24 with Nola Angles  Can we respond through MyChart? No. Contact the patient by phone at [phone]  Agent: Please be advised that Rx refills may take up to 3 business days. We ask that you follow-up with your pharmacy.

## 2024-03-20 ENCOUNTER — Other Ambulatory Visit: Payer: Self-pay | Admitting: Physician Assistant

## 2024-03-20 DIAGNOSIS — K219 Gastro-esophageal reflux disease without esophagitis: Secondary | ICD-10-CM

## 2024-03-24 ENCOUNTER — Encounter: Payer: Self-pay | Admitting: Physician Assistant

## 2024-03-24 ENCOUNTER — Ambulatory Visit (INDEPENDENT_AMBULATORY_CARE_PROVIDER_SITE_OTHER): Payer: MEDICAID | Admitting: Physician Assistant

## 2024-03-24 VITALS — BP 106/70 | HR 120 | Temp 97.6°F | Resp 22 | Ht 63.0 in | Wt 237.6 lb

## 2024-03-24 DIAGNOSIS — J4520 Mild intermittent asthma, uncomplicated: Secondary | ICD-10-CM

## 2024-03-24 DIAGNOSIS — R0603 Acute respiratory distress: Secondary | ICD-10-CM

## 2024-03-24 DIAGNOSIS — Z3A28 28 weeks gestation of pregnancy: Secondary | ICD-10-CM | POA: Diagnosis not present

## 2024-03-24 DIAGNOSIS — Z86711 Personal history of pulmonary embolism: Secondary | ICD-10-CM

## 2024-03-24 DIAGNOSIS — K732 Chronic active hepatitis, not elsewhere classified: Secondary | ICD-10-CM

## 2024-03-24 NOTE — Progress Notes (Signed)
 Acute Office Visit  Subjective:    Patient ID: Ana Reyes, female    DOB: Sep 20, 1985, 38 y.o.   MRN: [id number]  Chief Complaint  Patient presents with   Shortness of Breath    HPI: Patient walked into office stating she was having a hard time breathing and wanted to be seen Pt is at [redacted] weeks gestation She has a history of two PEs and is on Lovenox  daily - she states she has not missed any doses of medication She has been seen twice in the past week by Urgent Cares and given kenalog  injections, oral prednisone  and antibiotics.  She states her symptoms are worsening - She is having trouble catching her breath  She states she is having some chest tightness as well   Current Outpatient Medications:    albuterol  (PROVENTIL ) (2.5 MG/3ML) 0.083% nebulizer solution, Take 3 mLs (2.5 mg total) by nebulization every 4 (four) hours as needed for wheezing or shortness of breath., Disp: 75 mL, Rfl: 12   albuterol  (VENTOLIN  HFA) 108 (90 Base) MCG/ACT inhaler, Inhale 2 puffs into the lungs every 6 (six) hours as needed for wheezing or shortness of breath., Disp: 6.7 g, Rfl: 2   calcium  carbonate (TUMS - DOSED IN MG ELEMENTAL CALCIUM ) 500 MG chewable tablet, Chew 2 tablets by mouth 3 (three) times daily as needed for indigestion or heartburn., Disp: , Rfl:    cetirizine  (ZYRTEC  ALLERGY) 10 MG tablet, Take 1 tablet (10 mg total) by mouth daily., Disp: 90 tablet, Rfl: 3   diphenhydrAMINE  (BENADRYL ) 25 mg capsule, Take 25 mg by mouth at bedtime as needed., Disp: , Rfl:    enoxaparin  (LOVENOX ) 40 MG/0.4ML injection, 40 MG injection once daily., Disp: 12 mL, Rfl: 2   fluticasone (FLOVENT HFA) 220 MCG/ACT inhaler, Inhale 2 puffs into the lungs in the morning and at bedtime., Disp: , Rfl:    folic acid  (FOLVITE ) 1 MG tablet, Take 1 tablet by mouth daily., Disp: , Rfl:    nicotine  (NICODERM CQ  - DOSED IN MG/24 HOURS) 21 mg/24hr patch, Place 1 patch (21 mg total) onto the skin daily., Disp: 28 patch, Rfl:  0   Prenat MV-Min w/Fe-Folate-DHA (PRENATAL COMPLETE) CPPK, 1 po qd, Disp: 30 each, Rfl: 11  Allergies  Allergen Reactions   Latex Other (See Comments) and Dermatitis    Causes redness.  With condoms only  Other reaction(s): Other (See Comments)    Causes redness.    Other reaction(s): Other (See Comments)  Causes redness.  Other reaction(s): Other (See Comments) Other reaction(s): Other (See Comments) Causes redness. Causes redness.    With condoms only   Morphine  Itching and Nausea Only    Other reaction(s): Tramadol    Other reaction(s): ITCHING    Other reaction(s): Tramadol Other reaction(s): ITCHING   Morphine  And Codeine Nausea Only   Toradol [Ketorolac Tromethamine] Rash    Tolerates ibuprofen    Tramadol Nausea And Vomiting, Rash, Dermatitis and Other (See Comments)    Stomach Spasms    ROS CONSTITUTIONAL: pt had fever earlier in the week E/N/T: Negative for ear pain, nasal congestion and sore throat.  CARDIOVASCULAR: see HPI RESPIRATORY: see HPI GASTROINTESTINAL:negative INTEGUMENTARY: Negative for rash.      Objective:    PHYSICAL EXAM:   BP 106/70   Pulse (!) 120   Temp 97.6 F (36.4 C) (Temporal)   Resp (!) 22   Ht 5' 3 (1.6 m)   Wt 237 lb 9.6 oz (107.8 kg)  LMP 09/09/2023 (Approximate)   SpO2 (!) 88%   BMI 42.09 kg/m    GEN: pt acutely in respiratory distress with moderate wheezing noted audibly HEENT: normal external ears and nose - normal external auditory canals and TMS -- Lips, Teeth and Gums - normal  Oropharynx - normal mucosa, palate, and posterior pharynx Cardiac: RRR; Respiratory:  scattered wheezes and rhonchi throughout both lung fields - pt tachypneic Skin: warm and dry, no rash  Psych: euthymic mood, appropriate affect and demeanor    EKG - no acute changes Albuterol  neb given and pulse ox increased to staying 90-91% Assessment & Plan:    Acute respiratory distress  [redacted] weeks gestation of pregnancy  History of  pulmonary embolus (PE)  Mild intermittent extrinsic asthma without complication  Chronic active hepatitis (HCC)   PT WAS ADVISED TO LET US  CALL EMS BUT SHE REFUSED DUE TO TRANSPORTATION ISSUES AND HAS HER DAUGHTER WITH HER .  SHE STATES SHE WILL GO PICK UP HER HUSBAND FROM WORK AND HAVE HIM TAKE HER IMMEDIATELY TO ED FOR FURTHER EVALUATION AND TREATMENT Honor ED PHYSICIAN NOTIFIED  Follow-up: Return for AFTER ED EVALUATION.  An After Visit Summary was printed and given to the patient.  [facility]a Reyes [facility] [phone]

## 2024-03-26 ENCOUNTER — Telehealth: Payer: Self-pay

## 2024-03-26 ENCOUNTER — Ambulatory Visit: Payer: Self-pay

## 2024-03-26 ENCOUNTER — Encounter: Payer: Self-pay | Admitting: Physician Assistant

## 2024-03-26 NOTE — Telephone Encounter (Signed)
 SABRA

## 2024-03-26 NOTE — Telephone Encounter (Signed)
 Patient called and stated she need a new nebulizer sent in, Hospital sent [facility] but [facility] it.  Order was sent to [facility] Patient for Nebulizer, Patient made aware. Patient also stated the hospital gave her referral to pulmonologist and she has an appointment 04/06/24. Patient was told no need to follow up with PCP unless she feels worst and can't gt in to see specialist earlier

## 2024-03-26 NOTE — Telephone Encounter (Signed)
 FYI Only or Action Required?: Action required by provider: needs another prescription for nebulizer.  One was given yesterday in ER and pharmacy would not accept that.   Patient was last seen in primary care on 03/24/2024 by Nicholaus Credit, PA-C.  Called Nurse Triage reporting Wheezing.  Symptoms began yesterday.  Interventions attempted: Nothing.  Symptoms are: rapidly worsening.  Triage Disposition: No disposition on file.  Patient/caregiver understands and will follow disposition?:    Attempted to call [facility], no answer.                 Copied from CRM [id number]. Topic: Clinical - Red Word Triage >> Mar 26, 2024  1:33 PM Delon HERO wrote: Red Word that prompted transfer to Nurse Triage: Patent is calling to report that she went to [facility] yesterday reporting Shortness of Breathe & wheezing. Patient is caring for her grandmother that is COVID positive. Patient reporting that she was not tested for anything in the hospital. Advised at the pharmacy that a script is need for a new nebulizer machine. Patient reporting that the hospital wrote script for nebulizer machine but the pharmacy will not fill it.  Patient reporting she is scared she is going to die. Patient is unable to take a treatment.   Patient requesting script for Duoneb nebulizer - reporting this works better. Reason for Disposition  [1] MODERATE difficulty breathing (e.g., speaks in phrases, SOB even at rest, pulse 100-120) AND [2] NEW-onset or WORSE than normal  Answer Assessment - Initial Assessment Questions 1. RESPIRATORY STATUS: Describe your breathing? (e.g., wheezing, shortness of breath, unable to speak, severe coughing)      wheezing 2. ONSET: When did this breathing problem begin?      Yesterday, was seen in the ER. 3. PATTERN Does the difficult breathing come and go, or has it been constant since it started?      constant 4. SEVERITY: How bad is your breathing? (e.g., mild, moderate,  severe)      severe 5. RECURRENT SYMPTOM: Have you had difficulty breathing before? If Yes, ask: When was the last time? and What happened that time?      Yes, yesterday 6. CARDIAC HISTORY: Do you have any history of heart disease? (e.g., heart attack, angina, bypass surgery, angioplasty)      To ER 7. LUNG HISTORY: Do you have any history of lung disease?  (e.g., pulmonary embolus, asthma, emphysema)     To ER 8. CAUSE: What do you think is causing the breathing problem?      To ER 9. OTHER SYMPTOMS: Do you have any other symptoms? (e.g., chest pain, cough, dizziness, fever, runny nose)     To ER 10. O2 SATURATION MONITOR:  Do you use an oxygen saturation monitor (pulse oximeter) at home? If Yes, ask: What is your reading (oxygen level) today? What is your usual oxygen saturation reading? (e.g., 95%)       na 11. PREGNANCY: Is there any chance you are pregnant? When was your last menstrual period?       na 12. TRAVEL: Have you traveled out of the country in the last month? (e.g., travel history, exposures)       na  Protocols used: Breathing Difficulty-A-AH

## 2024-04-17 ENCOUNTER — Telehealth: Payer: Self-pay | Admitting: Physician Assistant

## 2024-04-17 NOTE — Telephone Encounter (Signed)
 Copied from CRM [id number]. Topic: Clinical - Medication Refill >> Apr 17, 2024  4:28 PM Zebedee SAUNDERS wrote: Medication: omeprazole  40 mg   Has the patient contacted their pharmacy? Yes (Agent: If no, request that the patient contact the pharmacy for the refill. If patient does not wish to contact the pharmacy document the reason why and proceed with request.) (Agent: If yes, when and what did the pharmacy advise?)  This is the patient's preferred pharmacy:  [facility] #[id number] [facility] [place], [place] - [address] [place] [address] Phone: [phone] Fax: [phone]  Is this the correct pharmacy for this prescription? Yes If no, delete pharmacy and type the correct one.   Has the prescription been filled recently? Yes  Is the patient out of the medication? Yes  Has the patient been seen for an appointment in the last year OR does the patient have an upcoming appointment? Yes  Can we respond through MyChart? Yes  Agent: Please be advised that Rx refills may take up to 3 business days. We ask that you follow-up with your pharmacy.

## 2024-04-17 NOTE — Telephone Encounter (Signed)
 Patient requesting Omeprazole  40 mg refill, not showing on active medication list.  Medication: omeprazole  40 mg    Has the patient contacted their pharmacy? Yes (Agent: If no, request that the patient contact the pharmacy for the refill. If patient does not wish to contact the pharmacy document the reason why and proceed with request.) (Agent: If yes, when and what did the pharmacy advise?)   This is the patient's preferred pharmacy:  [facility] #[id number] [facility], [place] - [address] [place] [address] Phone: [phone] Fax: [phone]

## 2024-04-20 NOTE — Telephone Encounter (Signed)
 She was prescribed voquezna instead of omeprazole  --- Harrie had given her this initially and she just got refill of it Not supposed to be taking omeprazole  with this

## 2024-04-20 NOTE — Telephone Encounter (Signed)
 Tried calling patient, unable leave message at this time, voicemail full, will try again later.

## 2024-04-21 ENCOUNTER — Other Ambulatory Visit: Payer: Self-pay | Admitting: Physician Assistant

## 2024-04-21 MED ORDER — OMEPRAZOLE 20 MG PO CPDR: 20.0000 mg | DELAYED_RELEASE_CAPSULE | Freq: Every day | ORAL | 3 refills | Status: DC

## 2024-04-21 NOTE — Telephone Encounter (Signed)
 Tried calling patient, unable leave message at this time, voicemail full, will try again later.

## 2024-04-21 NOTE — Telephone Encounter (Signed)
 Tried calling patient, unable leave message at this time, will try again later.

## 2024-04-21 NOTE — Telephone Encounter (Signed)
 Sorry - I had her mistaken for another patient about the voquezna I can send in rx for omeprazole  20mg  qd but would have her only use it as needed and make sure her  OB/GYN knows she is taking this medication

## 2024-04-21 NOTE — Telephone Encounter (Signed)
 Spoke with patient, patient states she is only taking Omeprazole . She states she has never been given the Voquezna that she is can think of.

## 2024-04-22 NOTE — Telephone Encounter (Signed)
 Tried calling patient, unable leave message at this time, voicemail full, will try again later.

## 2024-05-16 DIAGNOSIS — R0602 Shortness of breath: Secondary | ICD-10-CM | POA: Diagnosis not present

## 2024-05-16 DIAGNOSIS — Z3A34 34 weeks gestation of pregnancy: Secondary | ICD-10-CM | POA: Diagnosis not present

## 2024-07-03 ENCOUNTER — Telehealth: Payer: Self-pay

## 2024-07-03 NOTE — Transitions of Care (Post Inpatient/ED Visit) (Signed)
   07/03/2024  Name: Ana Reyes MRN: [id number] DOB: [birth year]  Today's TOC FU Call Status: Today's TOC FU Call Status:: Successful TOC FU Call Completed TOC FU Call Complete Date:  (Spoke with patient who states she does not feel she needs to participate in [facility] program - states she understands her medications and only request TOC RN to assist with scheduling PCP  Hospital follow up) Patient's Name and Date of Birth confirmed.  Transition Care Management Follow-up Telephone Call Date of Discharge: 07/02/24 Discharge Facility: Other ([facility]) Name of Other ([facility]) [facility]charge Facility: [facility] How have you been since you were released from the hospital?: Better Any questions or concerns?: No  Items Reviewed: Did you receive and understand the discharge instructions provided?: Yes   Follow up appointments reviewed: PCP Follow-up appointment confirmed?: Yes Date of PCP follow-up appointment?: 07/06/24 (Appointment scheduled by Care Guide) Follow-up Provider: PCP, Camie Nicholaus Shona Lauro RN, CCM [place]  [facility]BC[facility]-Population Health RN Care Manager [phone]

## 2024-07-06 ENCOUNTER — Inpatient Hospital Stay: Payer: MEDICAID | Admitting: Physician Assistant

## 2024-07-14 ENCOUNTER — Inpatient Hospital Stay: Payer: MEDICAID | Admitting: Physician Assistant

## 2024-07-27 ENCOUNTER — Other Ambulatory Visit: Payer: Self-pay | Admitting: Physician Assistant

## 2024-07-27 ENCOUNTER — Encounter: Payer: Self-pay | Admitting: Physician Assistant

## 2024-07-27 ENCOUNTER — Ambulatory Visit (INDEPENDENT_AMBULATORY_CARE_PROVIDER_SITE_OTHER): Payer: MEDICAID | Admitting: Physician Assistant

## 2024-07-27 VITALS — BP 108/60 | HR 102 | Temp 98.0°F | Resp 16 | Ht 63.0 in | Wt 196.2 lb

## 2024-07-27 DIAGNOSIS — D508 Other iron deficiency anemias: Secondary | ICD-10-CM

## 2024-07-27 DIAGNOSIS — F411 Generalized anxiety disorder: Secondary | ICD-10-CM

## 2024-07-27 DIAGNOSIS — J4520 Mild intermittent asthma, uncomplicated: Secondary | ICD-10-CM

## 2024-07-27 DIAGNOSIS — Z86711 Personal history of pulmonary embolism: Secondary | ICD-10-CM | POA: Diagnosis not present

## 2024-07-27 DIAGNOSIS — E538 Deficiency of other specified B group vitamins: Secondary | ICD-10-CM

## 2024-07-27 DIAGNOSIS — K250 Acute gastric ulcer with hemorrhage: Secondary | ICD-10-CM

## 2024-07-27 MED ORDER — FLUTICASONE-SALMETEROL 250-50 MCG/ACT IN AEPB: 1.0000 | INHALATION_SPRAY | Freq: Two times a day (BID) | RESPIRATORY_TRACT | 5 refills | Status: AC

## 2024-07-27 NOTE — Progress Notes (Signed)
 Subjective:  Patient ID: Ana Reyes, female    DOB: January 01, 1986  Age: 38 y.o. MRN: [id number]  Chief Complaint  Patient presents with   Hospitalization Follow-up    Discharged from [facility] 07/02/24    HPI Pt was admitted to [facility] on 06/29/24 and discharged on 07/02/24 for upper GI bleed and posthemorrhagic anemia.   She also had pneumonia.  She delivered her 3rd child by Csection on 06/16/24.   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 with ulceration noted - she was changed from omeprazole  to protonix  She was found to be anemic and received 2 units of blood - is on iron supplements at this time as well She was supposed to make follow up appt with him for colonoscopy but has not made appt  Pt finished all antibiotics for her pneumonia and has no current cough, cold or congestion  Pt states she stopped lovenox  about a week ago because she does not like it - advised this is putting her life at risk and for another clot and she needs to restart ASAP - she wanted to know about switching back to eliquis but do not advise that until she follows up with Dr Larene  Pt currently follows with [facility] for pain management and anxiety - they prescribe xanax and suboxone for patient    11/21/2023    1:55 PM 07/26/2023   10:19 AM 02/27/2023    3:50 PM 01/10/2023    3:40 PM 10/15/2022    1:51 PM  Depression screen PHQ 2/9  Decreased Interest 1 0 0 0 0  Down, Depressed, Hopeless 1 0 0 0 0  PHQ - 2 Score 2 0 0 0 0  Altered sleeping 2 0 0  0  Tired, decreased energy 2 0 0  0  Change in appetite 0 0 0  0  Feeling bad or failure about yourself  0 0 0  0  Trouble concentrating 2 0 0  0  Moving slowly or fidgety/restless 2 0 0  0  Suicidal thoughts 0 0 0  0  PHQ-9 Score 10  0  0   0   Difficult doing  work/chores Somewhat difficult Not difficult at all Not difficult at all  Not difficult at all     Data saved with a previous flowsheet row definition        10/15/2022    1:51 PM 01/10/2023    3:40 PM 02/27/2023    3:50 PM 07/26/2023   10:19 AM 11/21/2023    1:55 PM  Fall Risk  Falls in the past year? 0 0 0 0 0  Was there an injury with Fall? 0 0 0 0 0  Fall Risk Category Calculator 0 0 0 0 0  Patient at Risk for Falls Due to No Fall Risks No Fall Risks No Fall Risks No Fall Risks No Fall Risks  Fall risk Follow up Falls evaluation completed Falls evaluation completed Falls evaluation completed Falls evaluation completed Falls evaluation completed     ROS CONSTITUTIONAL: Negative for chills, fatigue, fever, unintentional weight gain and unintentional weight loss.  E/N/T: Negative for ear pain, nasal congestion and sore throat.  CARDIOVASCULAR: Negative for chest pain, dizziness, palpitations and pedal edema.  RESPIRATORY: Negative for recent cough and dyspnea.  GASTROINTESTINAL: Negative for abdominal pain,  acid reflux symptoms, constipation, diarrhea, nausea and vomiting.  MSK: Negative for arthralgias and myalgias.  INTEGUMENTARY: Negative for rash.  NEUROLOGICAL: Negative for dizziness and headaches.  PSYCHIATRIC: Negative for sleep disturbance and to question depression screen.  Negative for depression, negative for anhedonia.    Current Outpatient Medications:    albuterol  (PROVENTIL ) (2.5 MG/3ML) 0.083% nebulizer solution, Take 3 mLs (2.5 mg total) by nebulization every 4 (four) hours as needed for wheezing or shortness of breath., Disp: 75 mL, Rfl: 12   albuterol  (VENTOLIN  HFA) 108 (90 Base) MCG/ACT inhaler, Inhale 2 puffs into the lungs every 6 (six) hours as needed for wheezing or shortness of breath., Disp: 6.7 g, Rfl: 2   ALPRAZolam (XANAX) 0.5 MG tablet, Take 0.5 mg by mouth 2 (two) times daily., Disp: , Rfl:    Buprenorphine HCl-Naloxone HCl (SUBOXONE SL), Place under the  tongue., Disp: , Rfl:    calcium  carbonate (TUMS - DOSED IN MG ELEMENTAL CALCIUM ) 500 MG chewable tablet, Chew 2 tablets by mouth 3 (three) times daily as needed for indigestion or heartburn., Disp: , Rfl:    cetirizine  (ZYRTEC  ALLERGY) 10 MG tablet, Take 1 tablet (10 mg total) by mouth daily., Disp: 90 tablet, Rfl: 3   diphenhydrAMINE  (BENADRYL ) 25 mg capsule, Take 25 mg by mouth at bedtime as needed., Disp: , Rfl:    ferrous sulfate 325 (65 FE) MG EC tablet, Take 325 mg by mouth daily with breakfast., Disp: , Rfl:    pantoprazole  (PROTONIX ) 40 MG tablet, Take 40 mg by mouth 2 (two) times daily., Disp: , Rfl:    Prenat MV-Min w/Fe-Folate-DHA (PRENATAL COMPLETE) CPPK, 1 po qd, Disp: 30 each, Rfl: 11   enoxaparin  (LOVENOX ) 40 MG/0.4ML injection, 40 MG injection once daily. (Patient not taking: Reported on 07/27/2024), Disp: 12 mL, Rfl: 2   fluticasone-salmeterol (ADVAIR DISKUS) 250-50 MCG/ACT AEPB, Inhale 1 puff into the lungs in the morning and at bedtime., Disp: 1 each, Rfl: 5   folic acid  (FOLVITE ) 1 MG tablet, Take 1 tablet by mouth daily. (Patient not taking: Reported on 07/27/2024), Disp: , Rfl:    nicotine  (NICODERM CQ  - DOSED IN MG/24 HOURS) 21 mg/24hr patch, Place 1 patch (21 mg total) onto the skin daily. (Patient not taking: Reported on 07/27/2024), Disp: 28 patch, Rfl: 0  Past Medical History:  Diagnosis Date   Asthma    Cervical dysplasia    Endometriosis    Hx of hepatitis C    Hx pulmonary embolism    Kidney stones    Objective:  PHYSICAL EXAM:   BP 108/60   Pulse (!) 102   Temp 98 F (36.7 C)   Resp 16   Ht 5' 3 (1.6 m)   Wt 196 lb 3.2 oz (89 kg)   LMP 06/16/2024 (Approximate)   Breastfeeding No   BMI 34.76 kg/m    GEN: Well nourished, well developed, in no acute distress  Cardiac: RRR; no murmurs, rubs, or gallops,no edema - Respiratory:  normal respiratory rate and pattern with no distress - normal breath sounds with no rales, rhonchi, wheezes or rubs MS: no  deformity or atrophy  Skin: warm and dry, no rash  Neuro:  Alert and Oriented x 3,  - CN II-Xii grossly intact Psych: euthymic mood, appropriate affect and demeanor  Assessment & Plan:    Other iron deficiency anemia -     Fe+CBC/D/Plt+TIBC+Fer+Retic -     Comprehensive metabolic panel with GFR Continue iron supplement Pt to call Dr  Misenheimer to schedule follow up and colonoscopy B12 deficiency -     B12 and Folate Panel  History of pulmonary embolus (PE) Restart Lovenox !!! Mild intermittent extrinsic asthma without complication Continue albuterol  and advair Gastric ulcer with hemorrhage Continue protonix  Follow up with Dr Larene     Follow-up: Return in about 4 months (around 11/24/2024) for chronic fasting follow-up.  An After Visit Summary was printed and given to the patient.  [facility] [phone]

## 2024-11-24 ENCOUNTER — Ambulatory Visit: Payer: MEDICAID | Admitting: Physician Assistant
